# Patient Record
Sex: Female | Born: 1960 | Race: White | Hispanic: No | Marital: Married | Smoking: Never smoker
Health system: Southern US, Community
[De-identification: ages and names within clinical notes are randomized; demographics above are authoritative.]

## PROBLEM LIST (undated history)

## (undated) DIAGNOSIS — M79605 Pain in left leg: Secondary | ICD-10-CM

## (undated) DIAGNOSIS — M79604 Pain in right leg: Secondary | ICD-10-CM

## (undated) DIAGNOSIS — I10 Essential (primary) hypertension: Secondary | ICD-10-CM

## (undated) DIAGNOSIS — M5430 Sciatica, unspecified side: Secondary | ICD-10-CM

## (undated) HISTORY — DX: Essential (primary) hypertension: I10

## (undated) HISTORY — DX: Sciatica, unspecified side: M54.30

## (undated) HISTORY — PX: MYOMECTOMY: SHX85

## (undated) HISTORY — DX: Pain in left leg: M79.605

## (undated) HISTORY — DX: Pain in right leg: M79.604

---

## 1998-04-11 ENCOUNTER — Other Ambulatory Visit: Admission: RE | Admit: 1998-04-11 | Discharge: 1998-04-11 | Payer: Self-pay | Admitting: Obstetrics and Gynecology

## 1998-10-12 ENCOUNTER — Inpatient Hospital Stay (HOSPITAL_COMMUNITY): Admission: AD | Admit: 1998-10-12 | Discharge: 1998-10-15 | Payer: Self-pay | Admitting: Obstetrics and Gynecology

## 1998-11-23 ENCOUNTER — Other Ambulatory Visit: Admission: RE | Admit: 1998-11-23 | Discharge: 1998-11-23 | Payer: Self-pay | Admitting: Obstetrics and Gynecology

## 2000-01-10 ENCOUNTER — Other Ambulatory Visit: Admission: RE | Admit: 2000-01-10 | Discharge: 2000-01-10 | Payer: Self-pay | Admitting: Obstetrics and Gynecology

## 2016-03-11 ENCOUNTER — Other Ambulatory Visit: Payer: Self-pay | Admitting: Internal Medicine

## 2016-03-11 DIAGNOSIS — E2839 Other primary ovarian failure: Secondary | ICD-10-CM

## 2016-03-21 ENCOUNTER — Other Ambulatory Visit: Payer: Self-pay

## 2016-03-21 ENCOUNTER — Other Ambulatory Visit: Payer: Self-pay | Admitting: Internal Medicine

## 2016-03-21 DIAGNOSIS — Z1231 Encounter for screening mammogram for malignant neoplasm of breast: Secondary | ICD-10-CM

## 2016-04-12 ENCOUNTER — Ambulatory Visit
Admission: RE | Admit: 2016-04-12 | Discharge: 2016-04-12 | Disposition: A | Discharge status: Home / Self Care | Payer: PRIVATE HEALTH INSURANCE | Source: Ambulatory Visit | Attending: Internal Medicine | Admitting: Internal Medicine

## 2016-04-12 DIAGNOSIS — Z1231 Encounter for screening mammogram for malignant neoplasm of breast: Secondary | ICD-10-CM

## 2016-04-12 DIAGNOSIS — E2839 Other primary ovarian failure: Secondary | ICD-10-CM

## 2016-04-22 DIAGNOSIS — I10 Essential (primary) hypertension: Secondary | ICD-10-CM

## 2016-04-22 HISTORY — DX: Essential (primary) hypertension: I10

## 2016-09-10 ENCOUNTER — Encounter: Payer: Self-pay | Admitting: Gastroenterology

## 2016-10-10 ENCOUNTER — Ambulatory Visit (AMBULATORY_SURGERY_CENTER): Payer: Self-pay

## 2016-10-10 VITALS — Ht 62.0 in | Wt 133.8 lb

## 2016-10-10 DIAGNOSIS — Z1211 Encounter for screening for malignant neoplasm of colon: Secondary | ICD-10-CM

## 2016-10-10 MED ORDER — NA SULFATE-K SULFATE-MG SULF 17.5-3.13-1.6 GM/177ML PO SOLN: ORAL | 0 refills | Status: DC

## 2016-10-10 NOTE — Progress Notes (Signed)
Per pt, no allergies to soy or egg products.Pt not taking any weight loss meds or using  O2 at home.  Enmmi video sent to pt's email.

## 2016-10-15 ENCOUNTER — Encounter: Payer: Self-pay | Admitting: Gastroenterology

## 2016-10-24 ENCOUNTER — Ambulatory Visit (AMBULATORY_SURGERY_CENTER): Payer: PRIVATE HEALTH INSURANCE | Admitting: Gastroenterology

## 2016-10-24 ENCOUNTER — Encounter: Payer: Self-pay | Admitting: Gastroenterology

## 2016-10-24 VITALS — BP 135/87 | HR 70 | Temp 97.3°F | Resp 17 | Ht 62.0 in | Wt 133.0 lb

## 2016-10-24 DIAGNOSIS — Z1212 Encounter for screening for malignant neoplasm of rectum: Secondary | ICD-10-CM

## 2016-10-24 DIAGNOSIS — Z1211 Encounter for screening for malignant neoplasm of colon: Secondary | ICD-10-CM | POA: Diagnosis present

## 2016-10-24 MED ORDER — SODIUM CHLORIDE 0.9 % IV SOLN: 500.0000 mL | INTRAVENOUS | Status: DC

## 2016-10-24 NOTE — Patient Instructions (Signed)
Discharge instructions given. Norma exam. Resume previous medications. YOU HAD AN ENDOSCOPIC PROCEDURE TODAY AT THE [facility]:   Refer to the procedure report that was given to you for any specific questions about what was found during the examination.  If the procedure report does not answer your questions, please call your gastroenterologist to clarify.  If you requested that your care partner not be given the details of your procedure findings, then the procedure report has been included in a sealed envelope for you to review at your convenience later.  YOU SHOULD EXPECT: Some feelings of bloating in the abdomen. Passage of more gas than usual.  Walking can help get rid of the air that was put into your GI tract during the procedure and reduce the bloating. If you had a lower endoscopy (such as a colonoscopy or flexible sigmoidoscopy) you may notice spotting of blood in your stool or on the toilet paper. If you underwent a bowel prep for your procedure, you may not have a normal bowel movement for a few days.  Please Note:  You might notice some irritation and congestion in your nose or some drainage.  This is from the oxygen used during your procedure.  There is no need for concern and it should clear up in a day or so.  SYMPTOMS TO REPORT IMMEDIATELY:   Following lower endoscopy (colonoscopy or flexible sigmoidoscopy):  Excessive amounts of blood in the stool  Significant tenderness or worsening of abdominal pains  Swelling of the abdomen that is new, acute  Fever of 100F or higher   For urgent or emergent issues, a gastroenterologist can be reached at any hour by calling [phone].   DIET:  We do recommend a small meal at first, but then you may proceed to your regular diet.  Drink plenty of fluids but you should avoid alcoholic beverages for 24 hours.  ACTIVITY:  You should plan to take it easy for the rest of today and you should NOT DRIVE or use heavy machinery  until tomorrow (because of the sedation medicines used during the test).    FOLLOW UP: Our staff will call the number listed on your records the next business day following your procedure to check on you and address any questions or concerns that you may have regarding the information given to you following your procedure. If we do not reach you, we will leave a message.  However, if you are feeling well and you are not experiencing any problems, there is no need to return our call.  We will assume that you have returned to your regular daily activities without incident.  If any biopsies were taken you will be contacted by phone or by letter within the next 1-3 weeks.  Please call us at [phone] if you have not heard about the biopsies in 3 weeks.    SIGNATURES/CONFIDENTIALITY: You and/or your care partner have signed paperwork which will be entered into your electronic medical record.  These signatures attest to the fact that that the information above on your After Visit Summary has been reviewed and is understood.  Full responsibility of the confidentiality of this discharge information lies with you and/or your care-partner.

## 2016-10-24 NOTE — Progress Notes (Signed)
Alert and oriented x3, pleased with MAC, report to RN

## 2016-10-24 NOTE — Progress Notes (Signed)
Pt's states no medical or surgical changes since previsit or office visit. 

## 2016-10-24 NOTE — Op Note (Addendum)
[facility] Patient Name: Maria Delacruz Procedure Date: 10/24/2016 10:49 AM MRN: [id number] Endoscopist: Sherilyn Cooter L. Myrtie Neither , MD Age: 56 Referring MD:  Date of Birth: [birth year] Gender: Female Account #: [id number] Procedure:                Colonoscopy Indications:              Screening for colorectal malignant neoplasm Medicines:                Monitored Anesthesia Care Procedure:                Pre-Anesthesia Assessment:                           - Prior to the procedure, a History and Physical                            was performed, and patient medications and                            allergies were reviewed. The patient's tolerance of                            previous anesthesia was also reviewed. The risks                            and benefits of the procedure and the sedation                            options and risks were discussed with the patient.                            All questions were answered, and informed consent                            was obtained. Prior Anticoagulants: The patient has                            taken no previous anticoagulant or antiplatelet                            agents. ASA Grade Assessment: II - A patient with                            mild systemic disease. After reviewing the risks                            and benefits, the patient was deemed in                            satisfactory condition to undergo the procedure.                           After obtaining informed consent, the colonoscope  was passed under direct vision. Throughout the                            procedure, the patient's blood pressure, pulse, and                            oxygen saturations were monitored continuously. The                            Colonoscope was introduced through the anus and                            advanced to the the cecum, identified by                            appendiceal orifice and  ileocecal valve. The                            colonoscopy was performed without difficulty. The                            patient tolerated the procedure well. The quality                            of the bowel preparation was good. The ileocecal                            valve, appendiceal orifice, and rectum were                            photographed. The quality of the bowel preparation                            was evaluated using the BBPS [facility] Bowel                            Preparation Scale) with scores of: Right Colon = 2,                            Transverse Colon = 2 and Left Colon = 2. The total                            BBPS score equals 6. The bowel preparation used was                            SUPREP. Scope In: 10:56:09 AM Scope Out: 11:08:34 AM Scope Withdrawal Time: 0 hours 8 minutes 14 seconds  Total Procedure Duration: 0 hours 12 minutes 25 seconds  Findings:                 The perianal and digital rectal examinations were                            normal.  The entire examined colon appeared normal on direct                            and retroflexion views. Complications:            No immediate complications. Estimated Blood Loss:     Estimated blood loss: none. Impression:               - The entire examined colon is normal on direct and                            retroflexion views.                           - No specimens collected. Recommendation:           - Patient has a contact number available for                            emergencies. The signs and symptoms of potential                            delayed complications were discussed with the                            patient. Return to normal activities tomorrow.                            Written discharge instructions were provided to the                            patient.                           - Resume previous diet.                           - Continue  present medications.                           - Repeat colonoscopy in 10 years for screening                            purposes. Marnita Poirier L. Myrtie Neitheranis, MD 10/24/2016 11:17:05 AM This report has been signed electronically.

## 2016-10-25 ENCOUNTER — Telehealth: Payer: Self-pay | Admitting: *Deleted

## 2016-10-25 NOTE — Telephone Encounter (Signed)
  Follow up Call-  Call back number 10/24/2016  Post procedure Call Back phone  # [phone]  Permission to leave phone message Yes  Some recent data might be hidden     Patient questions:  Do you have a fever, pain , or abdominal swelling? No. Pain Score  0 *  Have you tolerated food without any problems? Yes.    Have you been able to return to your normal activities? Yes.    Do you have any questions about your discharge instructions: Diet   No. Medications  No. Follow up visit  No.  Do you have questions or concerns about your Care? No.  Actions: * If pain score is 4 or above: No action needed, pain <4.

## 2019-06-01 ENCOUNTER — Ambulatory Visit: Payer: PRIVATE HEALTH INSURANCE | Admitting: Family Medicine

## 2019-06-01 VITALS — BP 124/78 | HR 78 | Resp 16 | Ht 62.0 in | Wt 145.0 lb

## 2019-06-01 DIAGNOSIS — Z789 Other specified health status: Secondary | ICD-10-CM

## 2019-06-01 NOTE — Progress Notes (Signed)
  Subjective:     Patient ID: Maria Delacruz, female   DOB: 1960-06-19, 59 y.o.   MRN: [id number]  HPI Maria Delacruz presents to the employee health clinic today for her required wellness visit for her insurance. PMH and health maintenance items reviewed. She has a PCP, Dr. Concepcion Delacruz, who she last saw about 6 months ago. States lab work was completed. She reports her last mammogram was in July 2020 and normal. Last pap smear was in April 2020 and normal, and last colonoscopy was in 2018 and normal, f/u in 10 years. She received a flu shot last year and has completed both doses of the covid-19 vaccine. She denies any current problems or concerns.   Past Medical History:  Diagnosis Date  . Hypertension 04/2016   Due to stress.  . Leg pain, bilateral   . Sciatica    No Known Allergies  Current Outpatient Medications:  Marland Kitchen  Multiple Vitamins-Minerals (ONE-A-DAY WOMENS 50 PLUS PO), Take by mouth daily., Disp: , Rfl:     Review of Systems  Constitutional: Negative for chills, fatigue, fever and unexpected weight change.  HENT: Negative for congestion, ear pain, sinus pressure, sinus pain and sore throat.   Eyes: Negative for discharge and visual disturbance.  Respiratory: Negative for cough, shortness of breath and wheezing.   Cardiovascular: Negative for chest pain and leg swelling.  Gastrointestinal: Negative for abdominal pain, blood in stool, constipation, diarrhea, nausea and vomiting.  Genitourinary: Negative for difficulty urinating and hematuria.  Skin: Negative for color change.  Neurological: Negative for dizziness, weakness, light-headedness and headaches.  Hematological: Negative for adenopathy.  All other systems reviewed and are negative.      Objective:   Physical Exam Vitals and nursing note reviewed.  Constitutional:      General: She is not in acute distress.    Appearance: Normal appearance. She is well-developed. She is not toxic-appearing.  HENT:     Head: Normocephalic  and atraumatic.  Cardiovascular:     Rate and Rhythm: Normal rate and regular rhythm.     Heart sounds: Normal heart sounds. No murmur.  Pulmonary:     Effort: Pulmonary effort is normal. No respiratory distress.     Breath sounds: Normal breath sounds.  Musculoskeletal:     Cervical back: Neck supple.  Skin:    General: Skin is warm and dry.  Neurological:     Mental Status: She is alert and oriented to person, place, and time.  Psychiatric:        Mood and Affect: Mood normal.        Behavior: Behavior normal.    Today's Vitals   06/01/19 0945  BP: 124/78  Pulse: 78  Resp: 16  SpO2: 98%  Weight: 145 lb (65.8 kg)  Height: 5\' 2"  (1.575 m)   Body mass index is 26.52 kg/m.      Assessment:     Participant in health and wellness plan      Plan:     1. Keep all regular appts with PCP. 2. Encouraged healthy diet and regular exercise.  3. F/u here prn.

## 2019-08-25 ENCOUNTER — Other Ambulatory Visit (HOSPITAL_COMMUNITY)
Admission: RE | Admit: 2019-08-25 | Discharge: 2019-08-25 | Disposition: A | Discharge status: Home / Self Care | Payer: PRIVATE HEALTH INSURANCE | Source: Ambulatory Visit | Attending: Internal Medicine | Admitting: Internal Medicine

## 2019-08-25 ENCOUNTER — Ambulatory Visit: Payer: PRIVATE HEALTH INSURANCE | Admitting: Internal Medicine

## 2019-08-25 ENCOUNTER — Ambulatory Visit: Payer: Self-pay | Admitting: Internal Medicine

## 2019-08-25 ENCOUNTER — Other Ambulatory Visit: Payer: Self-pay

## 2019-08-25 ENCOUNTER — Encounter: Payer: Self-pay | Admitting: Internal Medicine

## 2019-08-25 VITALS — BP 110/88 | HR 76 | Temp 98.1°F | Ht 62.0 in | Wt 152.0 lb

## 2019-08-25 DIAGNOSIS — Z13220 Encounter for screening for lipoid disorders: Secondary | ICD-10-CM

## 2019-08-25 DIAGNOSIS — Z124 Encounter for screening for malignant neoplasm of cervix: Secondary | ICD-10-CM | POA: Insufficient documentation

## 2019-08-25 DIAGNOSIS — Z1231 Encounter for screening mammogram for malignant neoplasm of breast: Secondary | ICD-10-CM

## 2019-08-25 DIAGNOSIS — Z1329 Encounter for screening for other suspected endocrine disorder: Secondary | ICD-10-CM | POA: Insufficient documentation

## 2019-08-25 DIAGNOSIS — R8290 Unspecified abnormal findings in urine: Secondary | ICD-10-CM

## 2019-08-25 DIAGNOSIS — Z1321 Encounter for screening for nutritional disorder: Secondary | ICD-10-CM

## 2019-08-25 DIAGNOSIS — Z780 Asymptomatic menopausal state: Secondary | ICD-10-CM | POA: Diagnosis not present

## 2019-08-25 DIAGNOSIS — Z0000 Encounter for general adult medical examination without abnormal findings: Secondary | ICD-10-CM

## 2019-08-25 LAB — POCT URINALYSIS DIPSTICK
Appearance: NEGATIVE
Bilirubin, UA: NEGATIVE
Blood, UA: NEGATIVE
Glucose, UA: NEGATIVE
Ketones, UA: NEGATIVE
Nitrite, UA: NEGATIVE
Odor: NEGATIVE
Protein, UA: POSITIVE — AB
Spec Grav, UA: 1.01 (ref 1.010–1.025)
Urobilinogen, UA: 0.2 E.U./dL
pH, UA: 6 (ref 5.0–8.0)

## 2019-08-25 NOTE — Progress Notes (Signed)
   Subjective:    Patient ID: Maria Delacruz, female    DOB: 20-Dec-1960, 59 y.o.   MRN: [id number]  HPI Pleasant 59 year old Female, native of [place], seen today for the first time. Husband is also a patient here.  Her general health is excellent.  No known drug allergies.  Hospitalized only for childbirth by Dr. Cherly Hensen in June 2000.  No chronic medications.  Does take multivitamin daily.  Had tetanus immunization July  2018.  Had colonoscopy by Dr. Myrtie Neither in July 2018.  10-year follow-up recommended.  Entire colon was normal.  Last mammogram on file was December 2017.  This will be ordered.  Had bone density study December 2017 and lowest T score was -0.2.  Will not repeat at this point in time.  Patient is menopausal.  No issues with hot flashes.  Generally feels well.  Social history: She is married.  2 adult sons 1 age 55 and the other 1 age 56.  She is employed as a Agricultural engineer at [employer].  Family history: Father deceased at age 78 of cancer.  Mother living age 58 in good health.  She has 6 brothers and 4 sisters.  Some of the brothers have diabetes and hypertension.    Review of Systems only complaint is a bunion right MTP joint that sometimes gets tender and red.  Uric acid will be checked.  No chest pain, no shortness of breath.  No bowel issues.  No urinary issues.     Objective:   Physical Exam Blood pressure 110/88, pulse 76 temperature 98.1 degrees orally pulse oximetry 97% weight 152 pounds BMI 27.80  Skin warm and dry.  Nodes none.  Some cerumen in ear canals.  Neck is supple without JVD thyromegaly or carotid bruits.  Chest clear to auscultation.  Cardiac exam regular rate and rhythm normal S1 and S2 without murmurs or gallops.  Breast without masses.  Abdomen soft nondistended without hepatosplenomegaly masses or tenderness.  Pelvic exam: N FDG.  Pap smear taken.  No masses on bimanual exam rectovaginal confirms.  Has a small bunion right MTP joint that  currently is not inflamed or red.  Neuro intact without focal deficits.  Thought judgment and affect are normal.       Assessment & Plan:  Bunion right MTP joint  Fasting labs drawn and pending  Plan: Order placed for mammogram.  She is up-to-date on colonoscopy and had bone density study that was normal in 2017.  She has had 2 Covid vaccines and tetanus immunization is up-to-date.  Depending on lab work she can return in 1 year or as needed.

## 2019-08-25 NOTE — Patient Instructions (Signed)
It was a pleasure to see you today.  Order placed for mammogram.  Labs drawn and pending.  Expect if labs are normal, return in 1 year or as needed.

## 2019-08-26 ENCOUNTER — Other Ambulatory Visit: Payer: Self-pay

## 2019-08-26 DIAGNOSIS — R718 Other abnormality of red blood cells: Secondary | ICD-10-CM

## 2019-08-26 LAB — URINE CULTURE
MICRO NUMBER:: 10442103
Result:: NO GROWTH
SPECIMEN QUALITY:: ADEQUATE

## 2019-08-26 LAB — CYTOLOGY - PAP
Comment: NEGATIVE
Diagnosis: NEGATIVE
High risk HPV: NEGATIVE

## 2019-08-27 ENCOUNTER — Other Ambulatory Visit: Payer: Self-pay

## 2019-08-27 MED ORDER — VITAMIN D (ERGOCALCIFEROL) 1.25 MG (50000 UNIT) PO CAPS
50000.0000 [IU] | ORAL_CAPSULE | ORAL | 0 refills | Status: DC
Start: 2019-08-27 — End: 2020-02-24

## 2019-08-28 LAB — COMPLETE METABOLIC PANEL WITH GFR
AG Ratio: 1.3 (calc) (ref 1.0–2.5)
ALT: 18 U/L (ref 6–29)
AST: 21 U/L (ref 10–35)
Albumin: 4.1 g/dL (ref 3.6–5.1)
Alkaline phosphatase (APISO): 101 U/L (ref 37–153)
BUN: 11 mg/dL (ref 7–25)
CO2: 27 mmol/L (ref 20–32)
Calcium: 9.8 mg/dL (ref 8.6–10.4)
Chloride: 108 mmol/L (ref 98–110)
Creat: 0.84 mg/dL (ref 0.50–1.05)
GFR, Est African American: 89 mL/min/{1.73_m2} (ref >=60)
GFR, Est Non African American: 77 mL/min/{1.73_m2} (ref >=60)
Globulin: 3.2 g/dL (calc) (ref 1.9–3.7)
Glucose, Bld: 90 mg/dL (ref 65–99)
Potassium: 4.1 mmol/L (ref 3.5–5.3)
Sodium: 142 mmol/L (ref 135–146)
Total Bilirubin: 0.4 mg/dL (ref 0.2–1.2)
Total Protein: 7.3 g/dL (ref 6.1–8.1)

## 2019-08-28 LAB — CBC WITH DIFFERENTIAL/PLATELET
Absolute Monocytes: 488 cells/uL (ref 200–950)
Basophils Absolute: 20 cells/uL (ref 0–200)
Basophils Relative: 0.3 %
Eosinophils Absolute: 152 cells/uL (ref 15–500)
Eosinophils Relative: 2.3 %
HCT: 39.7 % (ref 35.0–45.0)
Hemoglobin: 12.1 g/dL (ref 11.7–15.5)
Lymphs Abs: 3221 cells/uL (ref 850–3900)
MCH: 21.3 pg — ABNORMAL LOW (ref 27.0–33.0)
MCHC: 30.5 g/dL — ABNORMAL LOW (ref 32.0–36.0)
MCV: 70 fL — ABNORMAL LOW (ref 80.0–100.0)
MPV: 12.1 fL (ref 7.5–12.5)
Monocytes Relative: 7.4 %
Neutro Abs: 2719 cells/uL (ref 1500–7800)
Neutrophils Relative %: 41.2 %
Platelets: 332 10*3/uL (ref 140–400)
RBC: 5.67 10*6/uL — ABNORMAL HIGH (ref 3.80–5.10)
RDW: 15.7 % — ABNORMAL HIGH (ref 11.0–15.0)
Total Lymphocyte: 48.8 %
WBC: 6.6 10*3/uL (ref 3.8–10.8)

## 2019-08-28 LAB — LIPID PANEL
Cholesterol: 217 mg/dL — ABNORMAL HIGH (ref <200)
HDL: 48 mg/dL — ABNORMAL LOW (ref >=50)
LDL Cholesterol (Calc): 146 mg/dL (calc) — ABNORMAL HIGH
Non-HDL Cholesterol (Calc): 169 mg/dL (calc) — ABNORMAL HIGH (ref <130)
Total CHOL/HDL Ratio: 4.5 (calc) (ref <5.0)
Triglycerides: 114 mg/dL (ref <150)

## 2019-08-28 LAB — TSH: TSH: 3.8 mIU/L (ref 0.40–4.50)

## 2019-08-28 LAB — TEST AUTHORIZATION

## 2019-08-28 LAB — IRON,TIBC AND FERRITIN PANEL
%SAT: 23 % (calc) (ref 16–45)
Ferritin: 40 ng/mL (ref 16–232)
Iron: 79 ug/dL (ref 45–160)
TIBC: 337 mcg/dL (calc) (ref 250–450)

## 2019-08-28 LAB — URIC ACID: Uric Acid, Serum: 5 mg/dL (ref 2.5–7.0)

## 2019-08-28 LAB — VITAMIN D 25 HYDROXY (VIT D DEFICIENCY, FRACTURES): Vit D, 25-Hydroxy: 21 ng/mL — ABNORMAL LOW (ref 30–100)

## 2019-08-31 ENCOUNTER — Ambulatory Visit
Admission: RE | Admit: 2019-08-31 | Discharge: 2019-08-31 | Disposition: A | Discharge status: Home / Self Care | Payer: PRIVATE HEALTH INSURANCE | Source: Ambulatory Visit | Attending: Internal Medicine | Admitting: Internal Medicine

## 2019-08-31 ENCOUNTER — Other Ambulatory Visit: Payer: Self-pay

## 2019-08-31 DIAGNOSIS — Z1231 Encounter for screening mammogram for malignant neoplasm of breast: Secondary | ICD-10-CM

## 2020-02-22 ENCOUNTER — Other Ambulatory Visit: Payer: PRIVATE HEALTH INSURANCE | Admitting: Internal Medicine

## 2020-02-22 ENCOUNTER — Other Ambulatory Visit: Payer: Self-pay

## 2020-02-22 DIAGNOSIS — E782 Mixed hyperlipidemia: Secondary | ICD-10-CM

## 2020-02-23 LAB — LIPID PANEL
Cholesterol: 252 mg/dL — ABNORMAL HIGH (ref <200)
HDL: 60 mg/dL (ref >=50)
LDL Cholesterol (Calc): 171 mg/dL (calc) — ABNORMAL HIGH
Non-HDL Cholesterol (Calc): 192 mg/dL (calc) — ABNORMAL HIGH (ref <130)
Total CHOL/HDL Ratio: 4.2 (calc) (ref <5.0)
Triglycerides: 94 mg/dL (ref <150)

## 2020-02-24 ENCOUNTER — Ambulatory Visit: Payer: PRIVATE HEALTH INSURANCE | Admitting: Internal Medicine

## 2020-02-24 ENCOUNTER — Encounter: Payer: Self-pay | Admitting: Internal Medicine

## 2020-02-24 ENCOUNTER — Other Ambulatory Visit: Payer: Self-pay

## 2020-02-24 VITALS — BP 120/70 | HR 66 | Ht 62.0 in | Wt 154.0 lb

## 2020-02-24 DIAGNOSIS — R232 Flushing: Secondary | ICD-10-CM

## 2020-02-24 DIAGNOSIS — E781 Pure hyperglyceridemia: Secondary | ICD-10-CM | POA: Diagnosis not present

## 2020-02-24 DIAGNOSIS — M21612 Bunion of left foot: Secondary | ICD-10-CM

## 2020-02-24 MED ORDER — ROSUVASTATIN CALCIUM 5 MG PO TABS: 5.0000 mg | ORAL_TABLET | Freq: Every day | ORAL | 1 refills | Status: DC

## 2020-02-24 NOTE — Patient Instructions (Addendum)
Start Crestor 5 mg daily and follow-up in 6 months at time of health maintenance exam..  Take it with supper.  May need to see podiatrist or orthopedist regarding bunion of left foot.  Would not suggest estrogen replacement for hot flashes unless they are just extremely intolerable.  We can refer you to GYN should you so desire.

## 2020-02-24 NOTE — Progress Notes (Signed)
   Subjective:    Patient ID: Maria Delacruz, female    DOB: 29-Sep-1960, 59 y.o.   MRN: [id number]  HPI 59 year old Female for 6 month follow up appointment.  She feels well and has no new complaints.  Does not take any chronic medications.  Presented here for the first time in May 2021 for fasting labs and health maintenance examination.  CBC revealed a low MCV of 70.  Her iron level was subsequently checked and was found to be normal.  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  Also at that time total cholesterol was 217, HDL 48, and LDL cholesterol 146.  She was advised to watch diet, continue exercise regimen and follow-up this month.  Recent lipid panel shows total cholesterol being 252 increased from 217 and LDL cholesterol has gone up from 146 to 171.  I would like her to take Crestor 5 mg daily.  After discussion, she is agreeable to this.    Review of Systems no chest pain, shortness of breath, abdominal issues.     Objective:   Physical Exam Blood pressure 120/70 pulse 66 pulse oximetry 96% weight 154 pounds BMI 28.17 No thyromegaly.  No carotid bruits.  Chest is clear to auscultation.  Cardiac exam regular rate and rhythm normal S1 and S2 without murmurs or gallops.  No lower extremity pitting edema.  Continues to have issues with bunion of left foot and may need to see orthopedist or podiatrist.  Also having issues with hot flashes.  Explained to her that it would best not to be treated with estrogen replacement.  We can refer her to GYN for further discussion of this issue.      Assessment & Plan:  Pure hypercholesterolemia-begin Crestor 5 mg daily and follow-up at physical exam in May.  Hot flashes-can be referred to GYN physician but would prefer she not start estrogen therapy.  Has been menopausal for several years.  Bunion left foot-May need to see podiatrist or orthopedist

## 2020-07-28 ENCOUNTER — Other Ambulatory Visit: Payer: Self-pay | Admitting: Internal Medicine

## 2020-07-28 DIAGNOSIS — Z1231 Encounter for screening mammogram for malignant neoplasm of breast: Secondary | ICD-10-CM

## 2020-08-01 ENCOUNTER — Ambulatory Visit: Payer: PRIVATE HEALTH INSURANCE | Admitting: Family Medicine

## 2020-08-01 VITALS — BP 118/82 | HR 72 | Ht 62.0 in | Wt 145.0 lb

## 2020-08-01 DIAGNOSIS — Z789 Other specified health status: Secondary | ICD-10-CM

## 2020-08-01 NOTE — Progress Notes (Signed)
  Subjective:     Patient ID: Maria Delacruz, female   DOB: 1960/06/11, 60 y.o.   MRN: [id number]  HPI Maria Delacruz presents to the employee health and wellness clinic today for her required wellness visit for her insurance. Her PCP is Dr. Lenord Fellers, who she sees regularly. She is UTD on all her health maintenance items. She has no current problems or concerns. She reports eating healthy but does not get any exercise and needs to work on this.   Past Medical History:  Diagnosis Date  . Hypertension 04/2016   Due to stress.  . Leg pain, bilateral   . Sciatica    No Known Allergies  Current Outpatient Medications:  Marland Kitchen  Multiple Vitamins-Minerals (ONE-A-DAY WOMENS 50 PLUS PO), Take by mouth daily., Disp: , Rfl:  .  rosuvastatin (CRESTOR) 5 MG tablet, Take 1 tablet (5 mg total) by mouth daily., Disp: 90 tablet, Rfl: 1  Review of Systems  Constitutional: Negative for chills, fatigue, fever and unexpected weight change.  HENT: Negative for congestion, ear pain, sinus pressure, sinus pain and sore throat.   Eyes: Negative for discharge and visual disturbance.  Respiratory: Negative for cough, shortness of breath and wheezing.   Cardiovascular: Negative for chest pain and leg swelling.  Gastrointestinal: Negative for abdominal pain, blood in stool, constipation, diarrhea, nausea and vomiting.  Genitourinary: Negative for difficulty urinating and hematuria.  Skin: Negative for color change.  Neurological: Negative for dizziness, weakness, light-headedness and headaches.  Hematological: Negative for adenopathy.  All other systems reviewed and are negative.      Objective:   Physical Exam Vitals reviewed.  Constitutional:      General: She is not in acute distress.    Appearance: Normal appearance. She is well-developed.  HENT:     Head: Normocephalic and atraumatic.  Eyes:     General:        Right eye: No discharge.        Left eye: No discharge.  Cardiovascular:     Rate and Rhythm:  Normal rate and regular rhythm.     Heart sounds: Normal heart sounds.  Pulmonary:     Effort: Pulmonary effort is normal. No respiratory distress.     Breath sounds: Normal breath sounds.  Musculoskeletal:     Cervical back: Neck supple.  Skin:    General: Skin is warm and dry.  Neurological:     Mental Status: She is alert and oriented to person, place, and time.  Psychiatric:        Mood and Affect: Mood normal.        Behavior: Behavior normal.    Today's Vitals   08/01/20 0836  BP: 118/82  Pulse: 72  SpO2: 98%  Weight: 145 lb (65.8 kg)  Height: 5\' 2"  (1.575 m)   Body mass index is 26.52 kg/m.     Assessment:     Participant in health and wellness plan      Plan:     1. Keep all regular appts with PCP. Continue efforts at eating a healthy diet and work on increasing physical activity. F/u here prn.

## 2020-08-18 ENCOUNTER — Other Ambulatory Visit: Payer: PRIVATE HEALTH INSURANCE | Admitting: Internal Medicine

## 2020-08-18 ENCOUNTER — Other Ambulatory Visit: Payer: Self-pay

## 2020-08-18 DIAGNOSIS — E781 Pure hyperglyceridemia: Secondary | ICD-10-CM

## 2020-08-18 DIAGNOSIS — Z1329 Encounter for screening for other suspected endocrine disorder: Secondary | ICD-10-CM

## 2020-08-18 DIAGNOSIS — Z1321 Encounter for screening for nutritional disorder: Secondary | ICD-10-CM

## 2020-08-18 DIAGNOSIS — M21612 Bunion of left foot: Secondary | ICD-10-CM

## 2020-08-18 DIAGNOSIS — E7800 Pure hypercholesterolemia, unspecified: Secondary | ICD-10-CM

## 2020-08-18 DIAGNOSIS — Z0000 Encounter for general adult medical examination without abnormal findings: Secondary | ICD-10-CM

## 2020-08-18 DIAGNOSIS — E785 Hyperlipidemia, unspecified: Secondary | ICD-10-CM

## 2020-08-18 DIAGNOSIS — E559 Vitamin D deficiency, unspecified: Secondary | ICD-10-CM

## 2020-08-19 LAB — CBC WITH DIFFERENTIAL/PLATELET
Absolute Monocytes: 442 cells/uL (ref 200–950)
Basophils Absolute: 33 cells/uL (ref 0–200)
Basophils Relative: 0.5 %
Eosinophils Absolute: 91 cells/uL (ref 15–500)
Eosinophils Relative: 1.4 %
HCT: 40.4 % (ref 35.0–45.0)
Hemoglobin: 12.3 g/dL (ref 11.7–15.5)
Lymphs Abs: 3218 cells/uL (ref 850–3900)
MCH: 21.4 pg — ABNORMAL LOW (ref 27.0–33.0)
MCHC: 30.4 g/dL — ABNORMAL LOW (ref 32.0–36.0)
MCV: 70.3 fL — ABNORMAL LOW (ref 80.0–100.0)
MPV: 10.8 fL (ref 7.5–12.5)
Monocytes Relative: 6.8 %
Neutro Abs: 2717 cells/uL (ref 1500–7800)
Neutrophils Relative %: 41.8 %
Platelets: 313 10*3/uL (ref 140–400)
RBC: 5.75 10*6/uL — ABNORMAL HIGH (ref 3.80–5.10)
RDW: 15.9 % — ABNORMAL HIGH (ref 11.0–15.0)
Total Lymphocyte: 49.5 %
WBC: 6.5 10*3/uL (ref 3.8–10.8)

## 2020-08-19 LAB — COMPLETE METABOLIC PANEL WITH GFR
AG Ratio: 1.4 (calc) (ref 1.0–2.5)
ALT: 26 U/L (ref 6–29)
AST: 41 U/L — ABNORMAL HIGH (ref 10–35)
Albumin: 4.2 g/dL (ref 3.6–5.1)
Alkaline phosphatase (APISO): 85 U/L (ref 37–153)
BUN: 9 mg/dL (ref 7–25)
CO2: 29 mmol/L (ref 20–32)
Calcium: 10.1 mg/dL (ref 8.6–10.4)
Chloride: 105 mmol/L (ref 98–110)
Creat: 0.8 mg/dL (ref 0.50–1.05)
GFR, Est African American: 94 mL/min/{1.73_m2} (ref >=60)
GFR, Est Non African American: 81 mL/min/{1.73_m2} (ref >=60)
Globulin: 3.1 g/dL (calc) (ref 1.9–3.7)
Glucose, Bld: 84 mg/dL (ref 65–99)
Potassium: 4.1 mmol/L (ref 3.5–5.3)
Sodium: 141 mmol/L (ref 135–146)
Total Bilirubin: 0.5 mg/dL (ref 0.2–1.2)
Total Protein: 7.3 g/dL (ref 6.1–8.1)

## 2020-08-19 LAB — VITAMIN D 25 HYDROXY (VIT D DEFICIENCY, FRACTURES): Vit D, 25-Hydroxy: 43 ng/mL (ref 30–100)

## 2020-08-19 LAB — LIPID PANEL
Cholesterol: 173 mg/dL (ref <200)
HDL: 56 mg/dL (ref >=50)
LDL Cholesterol (Calc): 97 mg/dL (calc)
Non-HDL Cholesterol (Calc): 117 mg/dL (calc) (ref <130)
Total CHOL/HDL Ratio: 3.1 (calc) (ref <5.0)
Triglycerides: 103 mg/dL (ref <150)

## 2020-08-19 LAB — TSH: TSH: 2.52 mIU/L (ref 0.40–4.50)

## 2020-08-25 ENCOUNTER — Other Ambulatory Visit: Payer: Self-pay

## 2020-08-25 ENCOUNTER — Encounter: Payer: Self-pay | Admitting: Internal Medicine

## 2020-08-25 ENCOUNTER — Ambulatory Visit (INDEPENDENT_AMBULATORY_CARE_PROVIDER_SITE_OTHER): Payer: No Typology Code available for payment source | Admitting: Internal Medicine

## 2020-08-25 VITALS — BP 140/90 | HR 61 | Ht 62.0 in | Wt 152.0 lb

## 2020-08-25 DIAGNOSIS — Z6827 Body mass index (BMI) 27.0-27.9, adult: Secondary | ICD-10-CM

## 2020-08-25 DIAGNOSIS — Z0000 Encounter for general adult medical examination without abnormal findings: Secondary | ICD-10-CM | POA: Diagnosis not present

## 2020-08-25 LAB — POCT URINALYSIS DIPSTICK
Appearance: NEGATIVE
Bilirubin, UA: NEGATIVE
Blood, UA: NEGATIVE
Glucose, UA: NEGATIVE
Ketones, UA: NEGATIVE
Leukocytes, UA: NEGATIVE
Nitrite, UA: NEGATIVE
Odor: NEGATIVE
Protein, UA: NEGATIVE
Spec Grav, UA: 1.01 (ref 1.010–1.025)
Urobilinogen, UA: 0.2 E.U./dL
pH, UA: 6.5 (ref 5.0–8.0)

## 2020-08-25 NOTE — Progress Notes (Signed)
   Subjective:    Patient ID: Maria Delacruz, female    DOB: [birth year], 61 y.o.   MRN: [id number]  HPI 60 year old Female see for health maintenance exam and evalutaion of medical issues.  Her general health is excellent.  No known drug allergies.  Hospitalized only for childbirth by Dr. Cherly Hensen in June 2000.  No chronic medications.  Had tetanus immunization July 2018.  A colonoscopy by Dr. Myrtie Neither in July 2018 with 10-year follow-up recommended.  Entire colon was normal.  Order placed for annual mammogram.  Had bone density study December 2017 lowest T score was -0.2 Will not repeat at this point in time.  Patient is menopausal.  No issues with hot flashes.  Generally feels well.  Social history: She is married.  2 adult sons.  She is employed as a Agricultural engineer.  Family history: Father deceased at age 23 of cancer.  Mother living in good health.  She has 6 brothers and 4 sisters.  Some of the brothers have diabetes and hypertension.          Review of Systems history of bunion right MTP joint.     Objective:   Physical Exam Blood pressure 140/90 pulse 61 pulse oximetry 96% weight 152 pounds BMI 27.80  Skin: Warm and dry.  No thyromegaly.  No JVD.  No carotid bruits.  No cervical adenopathy.  Chest is clear to auscultation without rales or wheezing.  Cardiac exam: Regular rate and rhythm, normal S1 and S2 without murmurs or gallops.  Breasts are without masses.  Pelvic exam: Pap smear taken in 2021.  No masses on bimanual exam.  Rectovaginal confirms.  Extremities: Small bunion right MTP joint currently not inflamed or red.  Neurological exam is intact without focal deficits.  Thought, judgment, affect are normal.       Assessment & Plan:  History of vitamin D deficiency.  Vitamin D level is now normal at 43 and a year ago was 21.  Mild elevation of SGOT at 41.  This was normal last year.  History of bunion right MTP joint  BMI 27-continue to work on  diet and exercise efforts  Immunizations are up-to-date including COVID-19 vaccines.  Plan: Return in 1 year or as needed.  Have annual mammogram.  Monitor BP at home and call if persistently elevated.

## 2020-08-25 NOTE — Patient Instructions (Addendum)
Immunizations are up to date. Have Covid vaccine in the Fall. Monitor BP at home and call if persistently elevated. It was a pleasure to see you today. Colonoscopy is up to date and mammogram is scheduled.  It was a pleasure to see you today.

## 2020-08-27 ENCOUNTER — Other Ambulatory Visit: Payer: Self-pay | Admitting: Internal Medicine

## 2020-09-19 ENCOUNTER — Other Ambulatory Visit: Payer: Self-pay

## 2020-09-19 ENCOUNTER — Ambulatory Visit
Admission: RE | Admit: 2020-09-19 | Discharge: 2020-09-19 | Disposition: A | Discharge status: Home / Self Care | Payer: PRIVATE HEALTH INSURANCE | Source: Ambulatory Visit | Attending: Internal Medicine | Admitting: Internal Medicine

## 2020-09-19 DIAGNOSIS — Z1231 Encounter for screening mammogram for malignant neoplasm of breast: Secondary | ICD-10-CM

## 2020-09-26 ENCOUNTER — Other Ambulatory Visit: Payer: No Typology Code available for payment source | Admitting: Internal Medicine

## 2020-09-26 ENCOUNTER — Other Ambulatory Visit: Payer: Self-pay

## 2020-09-26 DIAGNOSIS — R7989 Other specified abnormal findings of blood chemistry: Secondary | ICD-10-CM

## 2020-09-27 LAB — HEPATIC FUNCTION PANEL
AG Ratio: 1.3 (calc) (ref 1.0–2.5)
ALT: 20 U/L (ref 6–29)
AST: 23 U/L (ref 10–35)
Albumin: 4 g/dL (ref 3.6–5.1)
Alkaline phosphatase (APISO): 80 U/L (ref 37–153)
Bilirubin, Direct: 0.1 mg/dL (ref 0.0–0.2)
Globulin: 3.2 g/dL (calc) (ref 1.9–3.7)
Indirect Bilirubin: 0.3 mg/dL (calc) (ref 0.2–1.2)
Total Bilirubin: 0.4 mg/dL (ref 0.2–1.2)
Total Protein: 7.2 g/dL (ref 6.1–8.1)

## 2021-08-21 ENCOUNTER — Other Ambulatory Visit: Payer: No Typology Code available for payment source

## 2021-08-21 DIAGNOSIS — R5383 Other fatigue: Secondary | ICD-10-CM

## 2021-08-21 DIAGNOSIS — E781 Pure hyperglyceridemia: Secondary | ICD-10-CM

## 2021-08-21 DIAGNOSIS — R718 Other abnormality of red blood cells: Secondary | ICD-10-CM

## 2021-08-22 LAB — LIPID PANEL
Cholesterol: 231 mg/dL — ABNORMAL HIGH (ref <200)
HDL: 58 mg/dL (ref >=50)
LDL Cholesterol (Calc): 154 mg/dL (calc) — ABNORMAL HIGH
Non-HDL Cholesterol (Calc): 173 mg/dL (calc) — ABNORMAL HIGH (ref <130)
Total CHOL/HDL Ratio: 4 (calc) (ref <5.0)
Triglycerides: 85 mg/dL (ref <150)

## 2021-08-22 LAB — CBC WITH DIFFERENTIAL/PLATELET
Absolute Monocytes: 466 cells/uL (ref 200–950)
Basophils Absolute: 32 cells/uL (ref 0–200)
Basophils Relative: 0.5 %
Eosinophils Absolute: 132 cells/uL (ref 15–500)
Eosinophils Relative: 2.1 %
HCT: 38.6 % (ref 35.0–45.0)
Hemoglobin: 12 g/dL (ref 11.7–15.5)
Lymphs Abs: 3163 cells/uL (ref 850–3900)
MCH: 22 pg — ABNORMAL LOW (ref 27.0–33.0)
MCHC: 31.1 g/dL — ABNORMAL LOW (ref 32.0–36.0)
MCV: 70.8 fL — ABNORMAL LOW (ref 80.0–100.0)
MPV: 11.2 fL (ref 7.5–12.5)
Monocytes Relative: 7.4 %
Neutro Abs: 2507 cells/uL (ref 1500–7800)
Neutrophils Relative %: 39.8 %
Platelets: 294 10*3/uL (ref 140–400)
RBC: 5.45 10*6/uL — ABNORMAL HIGH (ref 3.80–5.10)
RDW: 15.5 % — ABNORMAL HIGH (ref 11.0–15.0)
Total Lymphocyte: 50.2 %
WBC: 6.3 10*3/uL (ref 3.8–10.8)

## 2021-08-22 LAB — COMPLETE METABOLIC PANEL WITH GFR
AG Ratio: 1.3 (calc) (ref 1.0–2.5)
ALT: 13 U/L (ref 6–29)
AST: 19 U/L (ref 10–35)
Albumin: 3.9 g/dL (ref 3.6–5.1)
Alkaline phosphatase (APISO): 83 U/L (ref 37–153)
BUN: 11 mg/dL (ref 7–25)
CO2: 27 mmol/L (ref 20–32)
Calcium: 9.5 mg/dL (ref 8.6–10.4)
Chloride: 108 mmol/L (ref 98–110)
Creat: 0.71 mg/dL (ref 0.50–1.03)
Globulin: 3.1 g/dL (calc) (ref 1.9–3.7)
Glucose, Bld: 88 mg/dL (ref 65–99)
Potassium: 4 mmol/L (ref 3.5–5.3)
Sodium: 142 mmol/L (ref 135–146)
Total Bilirubin: 0.5 mg/dL (ref 0.2–1.2)
Total Protein: 7 g/dL (ref 6.1–8.1)
eGFR: 98 mL/min/{1.73_m2} (ref >=60)

## 2021-08-22 LAB — TSH: TSH: 2.92 mIU/L (ref 0.40–4.50)

## 2021-08-24 ENCOUNTER — Other Ambulatory Visit: Payer: PRIVATE HEALTH INSURANCE | Admitting: Internal Medicine

## 2021-08-27 ENCOUNTER — Ambulatory Visit (INDEPENDENT_AMBULATORY_CARE_PROVIDER_SITE_OTHER): Payer: No Typology Code available for payment source | Admitting: Internal Medicine

## 2021-08-27 ENCOUNTER — Encounter: Payer: Self-pay | Admitting: Internal Medicine

## 2021-08-27 VITALS — BP 132/90 | HR 59 | Temp 98.9°F | Ht 60.5 in | Wt 151.8 lb

## 2021-08-27 DIAGNOSIS — Z0000 Encounter for general adult medical examination without abnormal findings: Secondary | ICD-10-CM | POA: Diagnosis not present

## 2021-08-27 DIAGNOSIS — E7800 Pure hypercholesterolemia, unspecified: Secondary | ICD-10-CM

## 2021-08-27 DIAGNOSIS — Z6829 Body mass index (BMI) 29.0-29.9, adult: Secondary | ICD-10-CM

## 2021-08-27 DIAGNOSIS — M21612 Bunion of left foot: Secondary | ICD-10-CM

## 2021-08-27 MED ORDER — ROSUVASTATIN CALCIUM 10 MG PO TABS: 10.0000 mg | ORAL_TABLET | Freq: Every day | ORAL | 3 refills | Status: DC

## 2021-08-27 NOTE — Progress Notes (Unsigned)
? ?  Subjective:  ? ? Patient ID: Maria Delacruz, female    DOB: August 16, 1960, 61 y.o.   MRN: [id number] ? ?HPI 61 year old Female seen for health maintenance exam and evaluation of medical issues. ? ? ? ?Review of Systems ? ?   ?Objective:  ? Physical Exam ? ? ? ? ?   ?Assessment & Plan:  ? ? ?

## 2021-08-28 ENCOUNTER — Other Ambulatory Visit: Payer: Self-pay | Admitting: Internal Medicine

## 2021-08-28 DIAGNOSIS — Z1231 Encounter for screening mammogram for malignant neoplasm of breast: Secondary | ICD-10-CM

## 2021-09-20 ENCOUNTER — Ambulatory Visit
Admission: RE | Admit: 2021-09-20 | Discharge: 2021-09-20 | Disposition: A | Discharge status: Home / Self Care | Payer: No Typology Code available for payment source | Source: Ambulatory Visit | Attending: Internal Medicine | Admitting: Internal Medicine

## 2021-12-25 ENCOUNTER — Other Ambulatory Visit: Payer: No Typology Code available for payment source

## 2021-12-25 DIAGNOSIS — E7800 Pure hypercholesterolemia, unspecified: Secondary | ICD-10-CM

## 2021-12-26 LAB — LIPID PANEL
Cholesterol: 157 mg/dL (ref <200)
HDL: 56 mg/dL (ref >=50)
LDL Cholesterol (Calc): 84 mg/dL (calc)
Non-HDL Cholesterol (Calc): 101 mg/dL (calc) (ref <130)
Total CHOL/HDL Ratio: 2.8 (calc) (ref <5.0)
Triglycerides: 81 mg/dL (ref <150)

## 2022-01-01 ENCOUNTER — Encounter: Payer: Self-pay | Admitting: Internal Medicine

## 2022-01-01 ENCOUNTER — Ambulatory Visit: Payer: No Typology Code available for payment source | Admitting: Internal Medicine

## 2022-01-01 VITALS — BP 130/82 | HR 61 | Temp 98.0°F | Ht 60.5 in | Wt 155.4 lb

## 2022-01-01 DIAGNOSIS — E7800 Pure hypercholesterolemia, unspecified: Secondary | ICD-10-CM

## 2022-01-01 DIAGNOSIS — Z23 Encounter for immunization: Secondary | ICD-10-CM | POA: Diagnosis not present

## 2022-01-01 DIAGNOSIS — Z6829 Body mass index (BMI) 29.0-29.9, adult: Secondary | ICD-10-CM | POA: Diagnosis not present

## 2022-01-01 NOTE — Progress Notes (Signed)
   Subjective:    Patient ID: Maria Delacruz, female    DOB: [birth year], 61 y.o.   MRN: [id number]  HPI pleasant 61 year old Female seen for follow-up today.  She was here for health maintenance exam in May.  At that time she was taking rosuvastatin 5 mg daily and her lipids were noted to be elevated with total cholesterol 231, HDL 58, triglycerides 85 and LDL cholesterol 154.  Rosuvastatin was increased to 10 mg daily and she is now here for follow-up.  Her general health is excellent.  She feels well.  Tolerating rosuvastatin without complaints.    Review of Systems no new complaints     Objective:   Physical Exam Blood sugar excellent 130/82 pulse 61 temperature 98 degrees pulse oximetry 96% weight 155 pounds 6.4 ounces BMI 29.85 Skin: Warm and dry.  No cervical adenopathy.  No thyromegaly.  No carotid bruits.  Chest is clear to auscultation.  Cardiac exam regular rate and rhythm without ectopy or murmur.  No lower extremity pitting edema.      Assessment & Plan:  Pure hypercholesterolemia-now normal lipid panel on rosuvastatin 10 mg daily.  Health maintenance-flu vaccine given and other vaccines discussed with her today.  BMI 29.85-continue to work on diet and exercise for weight loss.  Plan: Schedule health maintenance exam for May 2024.  COVID booster discussed with her.  She had pneumococcal vaccine in May.

## 2022-01-15 ENCOUNTER — Encounter: Payer: Self-pay | Admitting: Nurse Practitioner

## 2022-01-15 ENCOUNTER — Ambulatory Visit: Payer: No Typology Code available for payment source | Admitting: Nurse Practitioner

## 2022-01-15 VITALS — BP 136/76 | HR 73

## 2022-01-15 DIAGNOSIS — Z789 Other specified health status: Secondary | ICD-10-CM

## 2022-01-15 NOTE — Progress Notes (Signed)
   Subjective:  Patient ID: Maria Delacruz, female    DOB: 12-Jun-1960  Age: 61 y.o. MRN: [id number]  CC: Wellness Exam   HPI Maria Delacruz presents for wellness exam visit for insurance benefit.  Patient has a PCP: Dr. Tedra Senegal PMH significant for: HTN and Hypercholesteremia  Last labs per PCP were completed: May 2023  Health Maintenance:  Colonoscopy: completed 2010, repeat 2028 Mammogram: June 2023 DEXA: 2017     Smoker: Never  Immunizations:  Shingrix-  Has plans to complete this next week.  COVID- x4 Flu: completed 2 weeks ago.   Lifestyle: Diet- no specific diet.  Exercise- reports very active at work, but no exercise routine.      Past Medical History:  Diagnosis Date   Hypertension 04/2016   Due to stress.   Leg pain, bilateral    Sciatica     Past Surgical History:  Procedure Laterality Date   CESAREAN SECTION     twice   MYOMECTOMY      Outpatient Medications Prior to Visit  Medication Sig Dispense Refill   Multiple Vitamins-Minerals (ONE-A-DAY WOMENS 50 PLUS PO) Take by mouth daily.     rosuvastatin (CRESTOR) 10 MG tablet Take 1 tablet (10 mg total) by mouth daily. 90 tablet 3   No facility-administered medications prior to visit.    ROS Review of Systems  Respiratory:  Negative for shortness of breath.   Cardiovascular:  Negative for chest pain and leg swelling.  Gastrointestinal:  Negative for constipation and diarrhea.  Musculoskeletal:  Negative for arthralgias.  Neurological:  Negative for headaches.    Objective:  BP 136/76   Pulse 73   SpO2 99%   Physical Exam Constitutional:      General: She is not in acute distress. HENT:     Head: Normocephalic.  Cardiovascular:     Rate and Rhythm: Normal rate and regular rhythm.     Pulses: Normal pulses.     Heart sounds: Normal heart sounds.  Pulmonary:     Effort: Pulmonary effort is normal.     Breath sounds: Normal breath sounds.  Musculoskeletal:        General:  Normal range of motion.     Right lower leg: No edema.     Left lower leg: No edema.  Skin:    General: Skin is warm.  Neurological:     General: No focal deficit present.     Mental Status: She is alert and oriented to person, place, and time.  Psychiatric:        Mood and Affect: Mood normal.        Behavior: Behavior normal.      Assessment & Plan:    Maria Delacruz was seen today for wellness exam and wellness exam.  Diagnoses and all orders for this visit:  Participant in health and wellness plan Adult wellness physical was conducted today. Importance of diet and exercise were discussed in detail.  We reviewed immunizations and gave recommendations regarding current immunization needed for age.  Patient plans to obtain Shingrix vaccine via [employer].  Preventative health exams are up to date.  Patient was advised yearly wellness exam and follow-up with PCP as recommended.  Continue to monitor BP at home.     No orders of the defined types were placed in this encounter.   No orders of the defined types were placed in this encounter.   Follow-up: As needed.

## 2022-01-16 NOTE — Patient Instructions (Addendum)
Flu vaccine given.  Lipid panel completely normal on rosuvastatin 10 mg daily.  Return in May 2024 for health maintenance exam.  She had pneumococcal vaccine in May.  COVID booster discussed with her.

## 2022-06-27 IMAGING — MG MM DIGITAL SCREENING BILAT W/ TOMO AND CAD
8 series · 9 of 24 positions shown · non-contrast
Comparison: Previous exam(s).

CLINICAL DATA: Screening.

EXAM:
DIGITAL SCREENING BILATERAL MAMMOGRAM WITH TOMOSYNTHESIS AND CAD
TECHNIQUE: Bilateral screening digital craniocaudal and mediolateral oblique
mammograms were obtained. Bilateral screening digital breast
tomosynthesis was performed. The images were evaluated with
computer-aided detection.

[L MLO synth-2D]
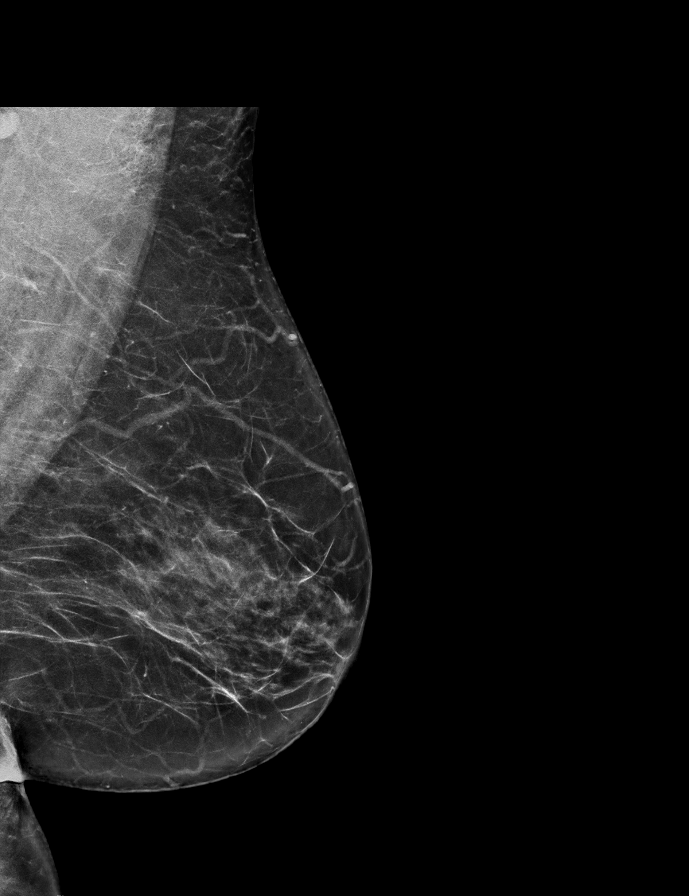

[R MLO synth-2D]
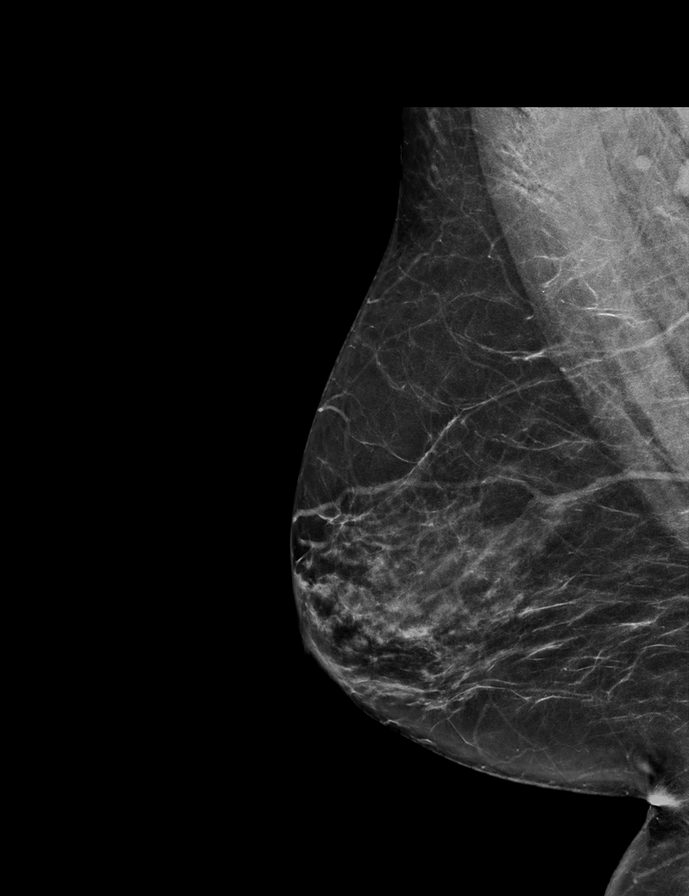

[L CC synth-2D]
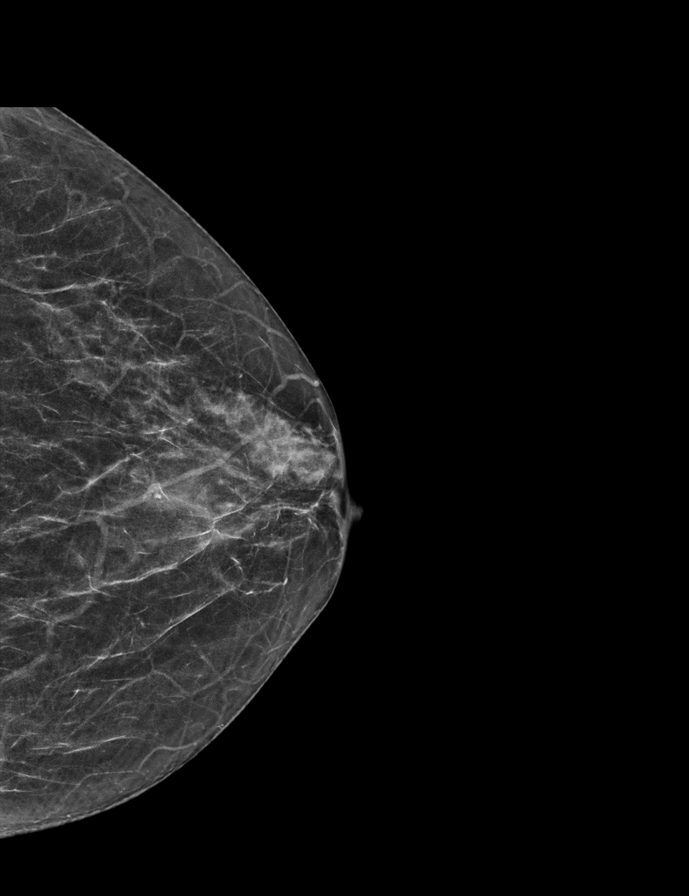

[R CC synth-2D]
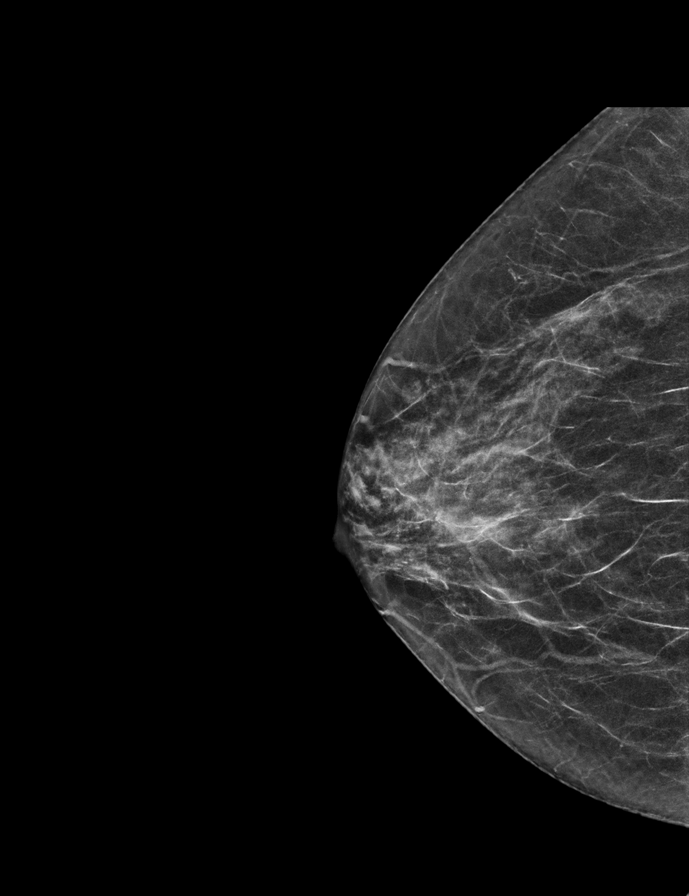

[R CC tomo · 2 of 52 frames shown]
[frame 17/52]
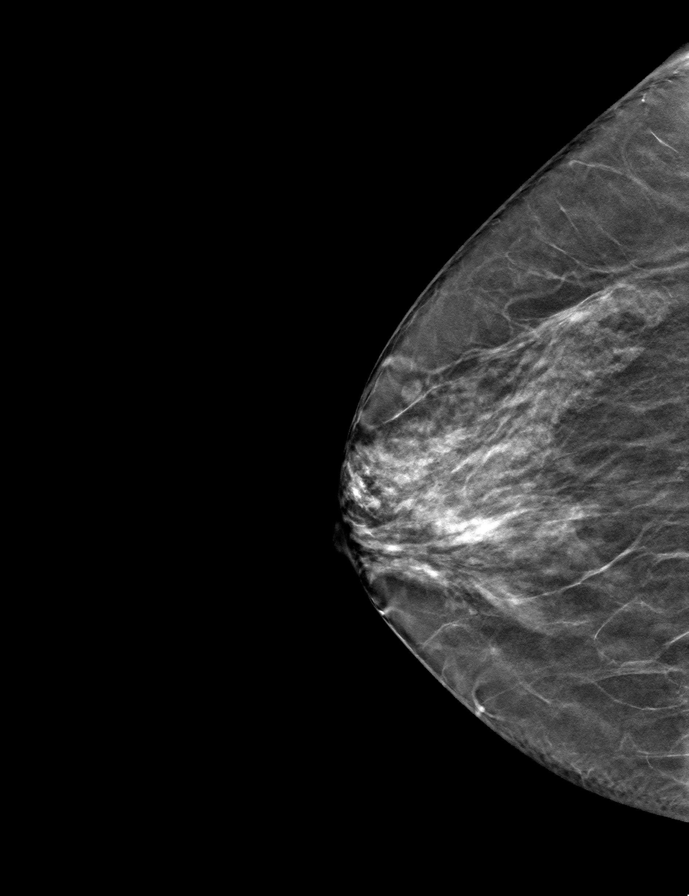
[frame 27/52]
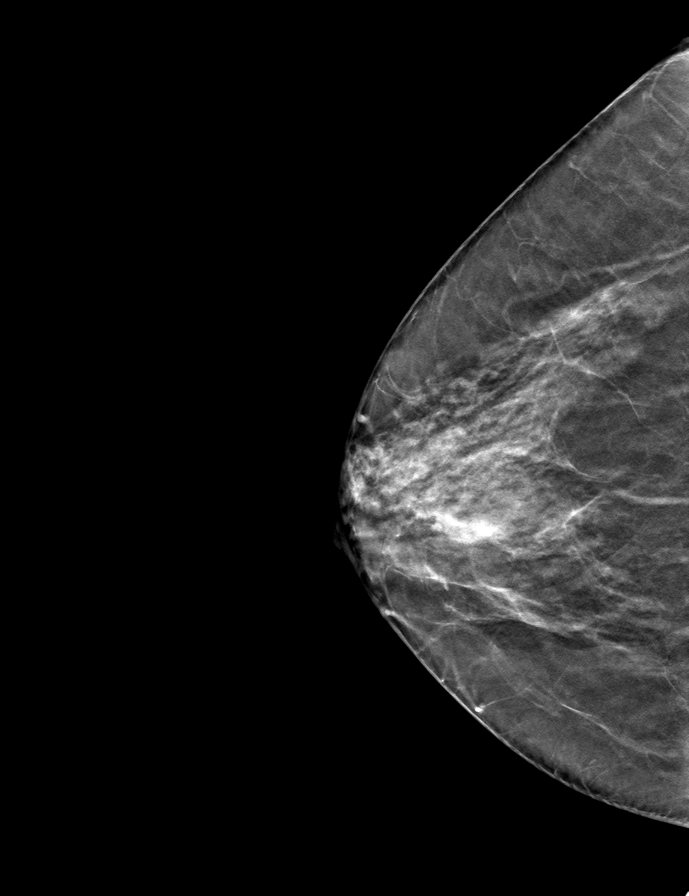

[L MLO tomo · tomo slice 33/64.0]
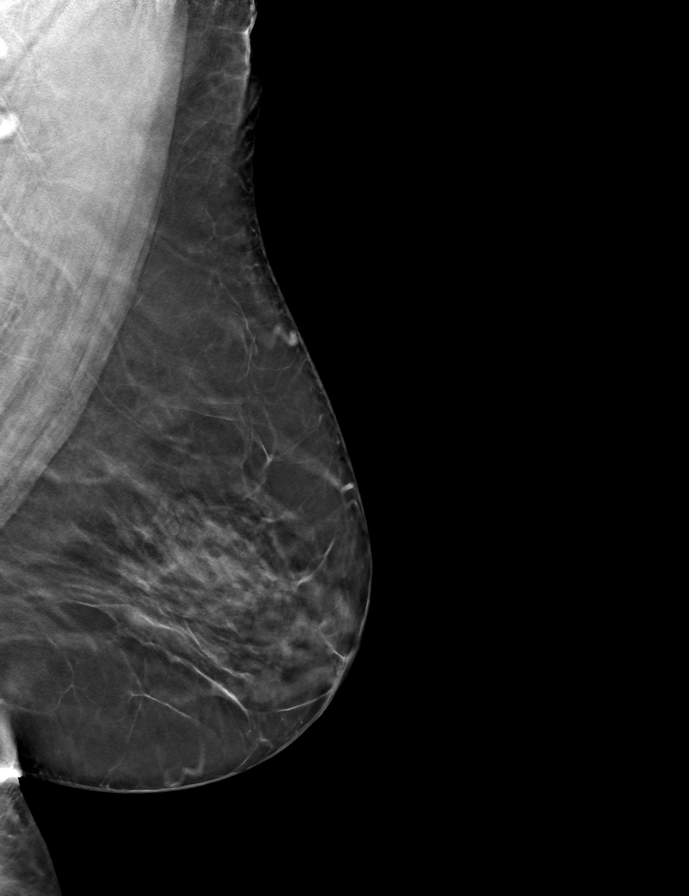

[R MLO tomo · tomo slice 31/60.0]
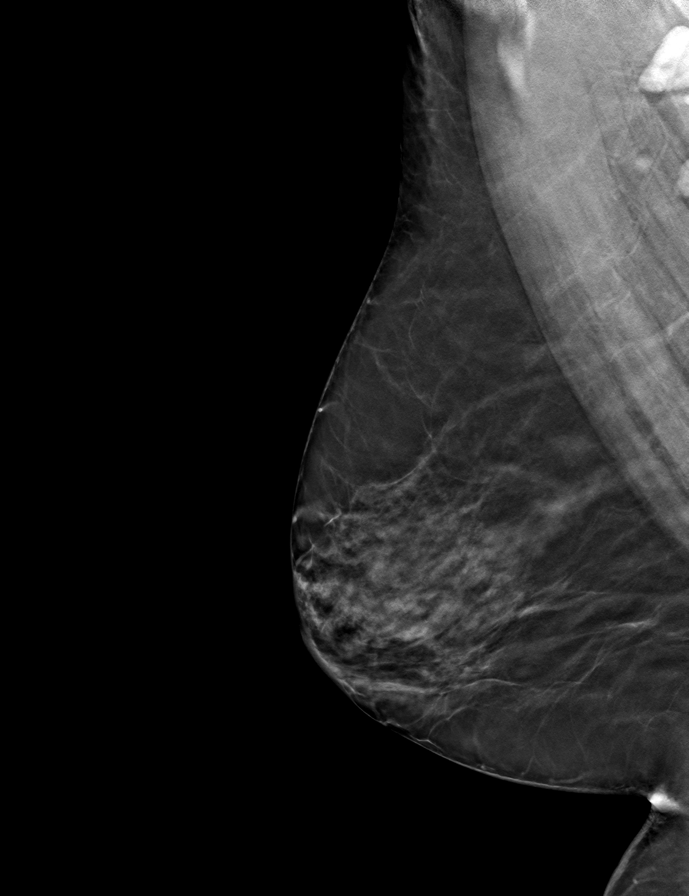

[L CC tomo · tomo slice 25/48.0]
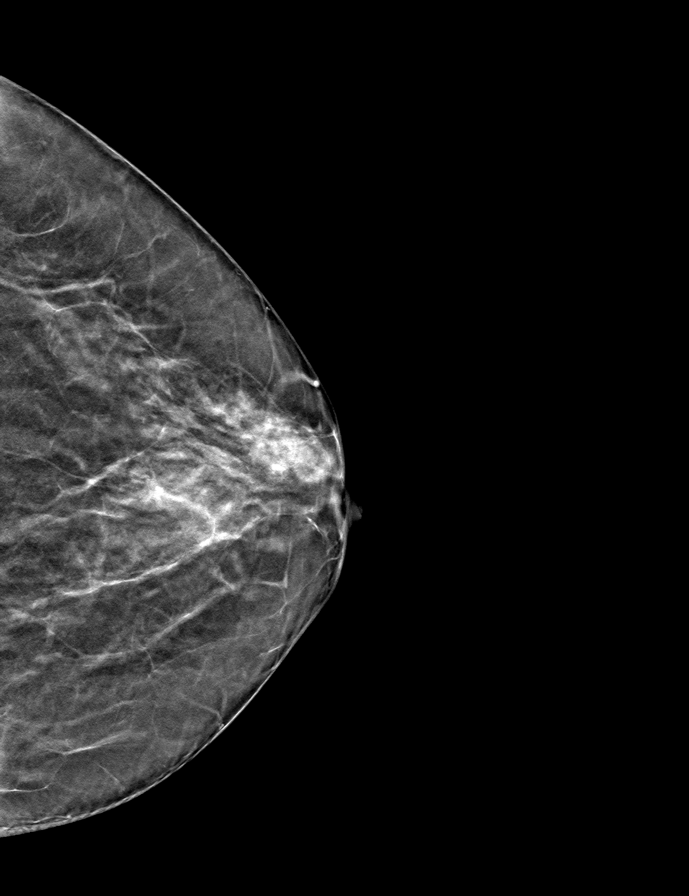

[9 of 24 positions shown; findings below may reference images not displayed]

ACR Breast Density Category c: The breast tissue is heterogeneously
dense, which may obscure small masses.
FINDINGS: There are no findings suspicious for malignancy.
IMPRESSION: No mammographic evidence of malignancy. A result letter of this
screening mammogram will be mailed directly to the patient.

RECOMMENDATION:
Screening mammogram in one year. (Code:Q3-W-BC3)

BI-RADS CATEGORY  1: Negative.

## 2022-07-30 ENCOUNTER — Other Ambulatory Visit: Payer: Self-pay | Admitting: Internal Medicine

## 2022-08-12 ENCOUNTER — Other Ambulatory Visit: Payer: Self-pay | Admitting: Internal Medicine

## 2022-08-12 DIAGNOSIS — Z1231 Encounter for screening mammogram for malignant neoplasm of breast: Secondary | ICD-10-CM

## 2022-08-22 NOTE — Progress Notes (Signed)
Patient Care Team: Margaree Mackintosh, MD as PCP - General (Internal Medicine)  Visit Date: 08/29/22  Subjective:    Patient ID: Maria Delacruz , Female   DOB: August 04, 1960, 62 y.o.    MRN: [id number]   62 y.o. Female presents today for annual comprehensive physical exam. Patient has a past medical history of hypertension, sciatica.  History of hyperlipidemia treated with rosuvastatin 10 mg daily. Lipid panel normal on 08/26/22.  TSH at 4.49. Glucose normal. Kidney, liver function normal. Electrolytes normal. Blood proteins normal. RBC elevated at 5.73, MCV low at 70.7, MCH low at 21.6, MCHC low at 30.6, RDW elevated at 16.  Pap smear last completed 08/25/19. Negative for intraepithelial lesion or malignancy.  Mammogram last completed 09/20/21. No mammographic evidence of malignancy. Repeat scheduled for 09/24/22.  Colonoscopy last completed 10/24/16. Entire examined colon was normal. Recommended repeat in 2028.  Past Medical History:  Diagnosis Date   Hypertension 04/2016   Due to stress.   Leg pain, bilateral    Sciatica      Family History  Problem Relation Age of Onset   Hypertension Mother    Throat cancer Father    Hypertension Brother    Hypertension Brother    Colon cancer Neg Hx    Stomach cancer Neg Hx     Social History   Social History Narrative   Not on file      Review of Systems  Constitutional:  Negative for chills, fever, malaise/fatigue and weight loss.  HENT:  Negative for hearing loss, sinus pain and sore throat.   Respiratory:  Negative for cough, hemoptysis and shortness of breath.   Cardiovascular:  Negative for chest pain, palpitations, leg swelling and PND.  Gastrointestinal:  Negative for abdominal pain, constipation, diarrhea, heartburn, nausea and vomiting.  Genitourinary:  Negative for dysuria, frequency and urgency.  Musculoskeletal:  Negative for back pain, myalgias and neck pain.  Skin:  Negative for itching and rash.  Neurological:   Negative for dizziness, tingling, seizures and headaches.  Endo/Heme/Allergies:  Negative for polydipsia.  Psychiatric/Behavioral:  Negative for depression. The patient is not nervous/anxious.         Objective:   Vitals: BP 130/80   Pulse 64   Temp 97.9 F (36.6 C) (Tympanic)   Ht 5\' 1"  (1.549 m)   Wt 154 lb (69.9 kg)   SpO2 97%   BMI 29.10 kg/m    Physical Exam Vitals and nursing note reviewed.  Constitutional:      General: She is not in acute distress.    Appearance: Normal appearance. She is not ill-appearing or toxic-appearing.  HENT:     Head: Normocephalic and atraumatic.     Right Ear: Hearing, tympanic membrane and external ear normal. There is impacted cerumen.     Left Ear: Hearing, tympanic membrane, ear canal and external ear normal.     Mouth/Throat:     Pharynx: Oropharynx is clear.  Eyes:     Extraocular Movements: Extraocular movements intact.     Pupils: Pupils are equal, round, and reactive to light.  Neck:     Thyroid: No thyroid mass, thyromegaly or thyroid tenderness.     Vascular: No carotid bruit.  Cardiovascular:     Rate and Rhythm: Normal rate and regular rhythm. No extrasystoles are present.    Pulses:          Dorsalis pedis pulses are 1+ on the right side and 1+ on the left side.  Heart sounds: Normal heart sounds. No murmur heard.    No friction rub. No gallop.  Pulmonary:     Effort: Pulmonary effort is normal.     Breath sounds: Normal breath sounds. No decreased breath sounds, wheezing, rhonchi or rales.  Chest:     Chest wall: No mass.  Abdominal:     Palpations: Abdomen is soft. There is no hepatomegaly, splenomegaly or mass.     Tenderness: There is no abdominal tenderness.     Hernia: No hernia is present.  Genitourinary:    Comments: NFEG. No masses on vaginal exam. Rectovaginal confirms. No stool. Pap smear taken. Musculoskeletal:     Cervical back: Normal range of motion.     Right lower leg: No edema.     Left  lower leg: No edema.  Lymphadenopathy:     Cervical: No cervical adenopathy.     Upper Body:     Right upper body: No supraclavicular adenopathy.     Left upper body: No supraclavicular adenopathy.  Skin:    General: Skin is warm and dry.  Neurological:     General: No focal deficit present.     Mental Status: She is alert and oriented to person, place, and time. Mental status is at baseline.     Sensory: Sensation is intact.     Motor: Motor function is intact. No weakness.     Deep Tendon Reflexes: Reflexes are normal and symmetric.  Psychiatric:        Attention and Perception: Attention normal.        Mood and Affect: Mood normal.        Speech: Speech normal.        Behavior: Behavior normal.        Thought Content: Thought content normal.        Cognition and Memory: Cognition normal.        Judgment: Judgment normal.       Results:   Studies obtained and personally reviewed by me:  Pap smear last completed 08/25/19. Negative for intraepithelial lesion or malignancy. Recommended repeat in 2024.  Mammogram last completed 09/20/21. No mammographic evidence of malignancy. Repeat scheduled for 09/24/22.  Colonoscopy last completed 10/24/16. Entire examined colon was normal. Recommended repeat in 2028.   Labs:       Component Value Date/Time   NA 141 08/26/2022 0903   K 4.1 08/26/2022 0903   CL 106 08/26/2022 0903   CO2 27 08/26/2022 0903   GLUCOSE 87 08/26/2022 0903   BUN 9 08/26/2022 0903   CREATININE 0.84 08/26/2022 0903   CALCIUM 9.7 08/26/2022 0903   PROT 7.5 08/26/2022 0903   AST 24 08/26/2022 0903   ALT 18 08/26/2022 0903   BILITOT 0.4 08/26/2022 0903   GFRNONAA 81 08/18/2020 1129   GFRAA 94 08/18/2020 1129     Lab Results  Component Value Date   WBC 6.9 08/26/2022   HGB 12.4 08/26/2022   HCT 40.5 08/26/2022   MCV 70.7 (L) 08/26/2022   PLT 296 08/26/2022    Lab Results  Component Value Date   CHOL 159 08/26/2022   HDL 67 08/26/2022   LDLCALC 77  08/26/2022   TRIG 72 08/26/2022   CHOLHDL 2.4 08/26/2022    No results found for: "HGBA1C"   Lab Results  Component Value Date   TSH 4.49 08/26/2022      Assessment & Plan:   Hyperlipidemia: controlled with rosuvastatin 10 mg daily. Lipid panel normal on 08/26/22.  Elevated TSH: TSH at 4.49. Will recheck this in 3 months.  Pap smear: last completed 08/25/19. Negative for intraepithelial lesion or malignancy.  Mammogram: last completed 09/20/21. No mammographic evidence of malignancy. Repeat scheduled for 09/24/22.  Colonoscopy: last completed 10/24/16. Entire examined colon was normal. Recommended repeat in 2028.  Vaccine counseling: vaccines discussed. UTD on flu, tetanus vaccines.  Return for TSH recheck in 3 months, no office visit.    I,Alexander Ruley,acting as a Neurosurgeon for Margaree Mackintosh, MD.,have documented all relevant documentation on the behalf of Margaree Mackintosh, MD,as directed by  Margaree Mackintosh, MD while in the presence of Margaree Mackintosh, MD.   I, Margaree Mackintosh, MD, have reviewed all documentation for this visit. The documentation on 10/05/22 for the exam, diagnosis, procedures, and orders are all accurate and complete.

## 2022-08-26 ENCOUNTER — Other Ambulatory Visit: Payer: No Typology Code available for payment source

## 2022-08-26 DIAGNOSIS — Z1329 Encounter for screening for other suspected endocrine disorder: Secondary | ICD-10-CM

## 2022-08-26 DIAGNOSIS — Z136 Encounter for screening for cardiovascular disorders: Secondary | ICD-10-CM

## 2022-08-26 DIAGNOSIS — E7800 Pure hypercholesterolemia, unspecified: Secondary | ICD-10-CM

## 2022-08-27 LAB — COMPLETE METABOLIC PANEL WITH GFR
AG Ratio: 1.2 (calc) (ref 1.0–2.5)
ALT: 18 U/L (ref 6–29)
AST: 24 U/L (ref 10–35)
Albumin: 4.1 g/dL (ref 3.6–5.1)
Alkaline phosphatase (APISO): 82 U/L (ref 37–153)
BUN: 9 mg/dL (ref 7–25)
CO2: 27 mmol/L (ref 20–32)
Calcium: 9.7 mg/dL (ref 8.6–10.4)
Chloride: 106 mmol/L (ref 98–110)
Creat: 0.84 mg/dL (ref 0.50–1.05)
Globulin: 3.4 g/dL (calc) (ref 1.9–3.7)
Glucose, Bld: 87 mg/dL (ref 65–99)
Potassium: 4.1 mmol/L (ref 3.5–5.3)
Sodium: 141 mmol/L (ref 135–146)
Total Bilirubin: 0.4 mg/dL (ref 0.2–1.2)
Total Protein: 7.5 g/dL (ref 6.1–8.1)
eGFR: 80 mL/min/{1.73_m2} (ref >=60)

## 2022-08-27 LAB — LIPID PANEL
Cholesterol: 159 mg/dL (ref <200)
HDL: 67 mg/dL (ref >=50)
LDL Cholesterol (Calc): 77 mg/dL (calc)
Non-HDL Cholesterol (Calc): 92 mg/dL (calc) (ref <130)
Total CHOL/HDL Ratio: 2.4 (calc) (ref <5.0)
Triglycerides: 72 mg/dL (ref <150)

## 2022-08-27 LAB — CBC WITH DIFFERENTIAL/PLATELET
Absolute Monocytes: 531 cells/uL (ref 200–950)
Basophils Absolute: 21 cells/uL (ref 0–200)
Basophils Relative: 0.3 %
Eosinophils Absolute: 83 cells/uL (ref 15–500)
Eosinophils Relative: 1.2 %
HCT: 40.5 % (ref 35.0–45.0)
Hemoglobin: 12.4 g/dL (ref 11.7–15.5)
Lymphs Abs: 3705 cells/uL (ref 850–3900)
MCH: 21.6 pg — ABNORMAL LOW (ref 27.0–33.0)
MCHC: 30.6 g/dL — ABNORMAL LOW (ref 32.0–36.0)
MCV: 70.7 fL — ABNORMAL LOW (ref 80.0–100.0)
MPV: 11.3 fL (ref 7.5–12.5)
Monocytes Relative: 7.7 %
Neutro Abs: 2560 cells/uL (ref 1500–7800)
Neutrophils Relative %: 37.1 %
Platelets: 296 10*3/uL (ref 140–400)
RBC: 5.73 10*6/uL — ABNORMAL HIGH (ref 3.80–5.10)
RDW: 16 % — ABNORMAL HIGH (ref 11.0–15.0)
Total Lymphocyte: 53.7 %
WBC: 6.9 10*3/uL (ref 3.8–10.8)

## 2022-08-27 LAB — TSH: TSH: 4.49 mIU/L (ref 0.40–4.50)

## 2022-08-29 ENCOUNTER — Ambulatory Visit: Payer: No Typology Code available for payment source | Admitting: Internal Medicine

## 2022-08-29 ENCOUNTER — Other Ambulatory Visit (HOSPITAL_COMMUNITY)
Admission: RE | Admit: 2022-08-29 | Discharge: 2022-08-29 | Disposition: A | Discharge status: Home / Self Care | Payer: No Typology Code available for payment source | Source: Ambulatory Visit | Attending: Internal Medicine | Admitting: Internal Medicine

## 2022-08-29 ENCOUNTER — Other Ambulatory Visit: Payer: Self-pay

## 2022-08-29 ENCOUNTER — Encounter: Payer: Self-pay | Admitting: Internal Medicine

## 2022-08-29 VITALS — BP 130/80 | HR 64 | Temp 97.9°F | Ht 61.0 in | Wt 154.0 lb

## 2022-08-29 DIAGNOSIS — Z0000 Encounter for general adult medical examination without abnormal findings: Secondary | ICD-10-CM | POA: Insufficient documentation

## 2022-08-29 DIAGNOSIS — E7800 Pure hypercholesterolemia, unspecified: Secondary | ICD-10-CM | POA: Diagnosis not present

## 2022-08-29 DIAGNOSIS — R7989 Other specified abnormal findings of blood chemistry: Secondary | ICD-10-CM | POA: Diagnosis not present

## 2022-08-29 DIAGNOSIS — Z6829 Body mass index (BMI) 29.0-29.9, adult: Secondary | ICD-10-CM

## 2022-08-29 LAB — POCT URINALYSIS DIPSTICK
Bilirubin, UA: NEGATIVE
Blood, UA: NEGATIVE
Glucose, UA: NEGATIVE
Ketones, UA: NEGATIVE
Leukocytes, UA: NEGATIVE
Nitrite, UA: NEGATIVE
Protein, UA: NEGATIVE
Spec Grav, UA: 1.015 (ref 1.010–1.025)
Urobilinogen, UA: 0.2 E.U./dL
pH, UA: 5 (ref 5.0–8.0)

## 2022-08-29 MED ORDER — ROSUVASTATIN CALCIUM 10 MG PO TABS: 10.0000 mg | ORAL_TABLET | Freq: Every day | ORAL | 0 refills | Status: DC

## 2022-08-30 LAB — CYTOLOGY - PAP
Comment: NEGATIVE
Diagnosis: NEGATIVE
High risk HPV: NEGATIVE

## 2022-09-24 ENCOUNTER — Ambulatory Visit
Admission: RE | Admit: 2022-09-24 | Discharge: 2022-09-24 | Disposition: A | Discharge status: Home / Self Care | Payer: No Typology Code available for payment source | Source: Ambulatory Visit | Attending: Internal Medicine | Admitting: Internal Medicine

## 2022-09-24 DIAGNOSIS — Z1231 Encounter for screening mammogram for malignant neoplasm of breast: Secondary | ICD-10-CM

## 2022-10-05 DIAGNOSIS — E785 Hyperlipidemia, unspecified: Secondary | ICD-10-CM | POA: Insufficient documentation

## 2022-10-05 NOTE — Patient Instructions (Signed)
It was a pleasure to see you today.  Continue rosuvastatin 10 mg daily.  Your TSH is slightly elevated at 4.49.  You could be developing early hypothyroidism.  We will recheck this in 3 months without office visit.  It will be a lab only and you do not have to fast.  Vaccines discussed.  Next physical exam in 1 year.

## 2022-12-05 ENCOUNTER — Other Ambulatory Visit: Payer: No Typology Code available for payment source

## 2022-12-05 DIAGNOSIS — R7989 Other specified abnormal findings of blood chemistry: Secondary | ICD-10-CM

## 2022-12-06 LAB — TSH: TSH: 3.8 m[IU]/L (ref 0.40–4.50)

## 2023-01-14 ENCOUNTER — Ambulatory Visit: Payer: No Typology Code available for payment source | Admitting: Nurse Practitioner

## 2023-01-14 ENCOUNTER — Encounter: Payer: Self-pay | Admitting: Nurse Practitioner

## 2023-01-14 VITALS — BP 134/80 | HR 90

## 2023-01-14 DIAGNOSIS — Z789 Other specified health status: Secondary | ICD-10-CM

## 2023-01-14 NOTE — Progress Notes (Signed)
[facility]  Subjective:  Patient ID: Maria Delacruz, female    DOB: 06-02-1960  Age: 62 y.o. MRN: [id number]  CC: Wellness Exam   HPI Maria Delacruz presents for wellness exam visit for insurance benefit.  Patient has a PCP: Dr. Lenord Fellers PMH significant for: HTN and HLD Last labs per PCP were completed; May 2024  Health Maintenance:  Colonoscopy: Due 2028 Mammogram: June 2024 Pap: completed 2024    Smoker: never  Immunizations:  Shingrix-  completed COVID- x4 Tdap- due 2028 Flu- yearly  Lifestyle: Diet- no particular diet Exercise- active at work.     Past Medical History:  Diagnosis Date   Hypertension 04/2016   Due to stress.   Leg pain, bilateral    Sciatica     Past Surgical History:  Procedure Laterality Date   CESAREAN SECTION     twice   MYOMECTOMY      Outpatient Medications Prior to Visit  Medication Sig Dispense Refill   Multiple Vitamins-Minerals (ONE-A-DAY WOMENS 50 PLUS PO) Take by mouth daily.     rosuvastatin (CRESTOR) 10 MG tablet Take 1 tablet (10 mg total) by mouth daily. 90 tablet 0   No facility-administered medications prior to visit.    ROS Review of Systems  Constitutional:  Negative for fatigue.  HENT:  Negative for hearing loss.   Eyes:  Negative for visual disturbance.  Respiratory:  Negative for shortness of breath.   Cardiovascular:  Negative for chest pain and leg swelling.  Gastrointestinal:  Negative for constipation, diarrhea, nausea and vomiting.  Musculoskeletal:  Negative for arthralgias, back pain and joint swelling.  Neurological:  Negative for headaches.  Psychiatric/Behavioral:  Negative for sleep disturbance.     Objective:  BP 134/80 (BP Location: Left Arm, Patient Position: Sitting)   Pulse 90   SpO2 97%   Physical Exam Constitutional:      General: She is not in acute distress. HENT:     Head: Normocephalic.     Right Ear: Tympanic membrane, ear canal and external ear  normal.     Left Ear: Tympanic membrane, ear canal and external ear normal.     Mouth/Throat:     Pharynx: No oropharyngeal exudate or posterior oropharyngeal erythema.  Eyes:     Pupils: Pupils are equal, round, and reactive to light.  Cardiovascular:     Rate and Rhythm: Normal rate and regular rhythm.     Heart sounds: Normal heart sounds.  Pulmonary:     Effort: Pulmonary effort is normal.  Abdominal:     General: Bowel sounds are normal.     Palpations: Abdomen is soft.  Musculoskeletal:        General: Normal range of motion.     Right lower leg: No edema.     Left lower leg: No edema.  Skin:    General: Skin is warm.  Neurological:     General: No focal deficit present.     Mental Status: She is alert and oriented to person, place, and time.  Psychiatric:        Mood and Affect: Mood normal.        Behavior: Behavior normal.      Assessment & Plan:    Maria Delacruz was seen today for wellness exam.  Diagnoses and all orders for this visit:  Participant in health and wellness plan Adult wellness physical was conducted today. Importance of diet and exercise were discussed in detail.  We reviewed immunizations and gave  recommendations regarding current immunization needed for age.  Preventative health exams are up to date.   Patient was advised yearly wellness exam and follow-up with PCP as needed.     No orders of the defined types were placed in this encounter.   No orders of the defined types were placed in this encounter.   Follow-up: as needed

## 2023-01-31 ENCOUNTER — Other Ambulatory Visit: Payer: Self-pay | Admitting: Internal Medicine

## 2023-01-31 DIAGNOSIS — E7800 Pure hypercholesterolemia, unspecified: Secondary | ICD-10-CM

## 2023-01-31 MED ORDER — ROSUVASTATIN CALCIUM 10 MG PO TABS: 10.0000 mg | ORAL_TABLET | Freq: Every day | ORAL | 2 refills | Status: DC

## 2023-01-31 MED ORDER — ROSUVASTATIN CALCIUM 10 MG PO TABS
10.0000 mg | ORAL_TABLET | Freq: Every day | ORAL | 3 refills | Status: DC
Start: 2023-01-31 — End: 2023-09-02

## 2023-01-31 NOTE — Addendum Note (Signed)
Addended by: Gregery Na on: 01/31/2023 02:21 PM   Modules accepted: Orders

## 2023-01-31 NOTE — Addendum Note (Signed)
Addended by: Gregery Na on: 01/31/2023 10:40 AM   Modules accepted: Orders

## 2023-01-31 NOTE — Addendum Note (Signed)
Addended by: Gregery Na on: 01/31/2023 02:14 PM   Modules accepted: Orders

## 2023-08-11 ENCOUNTER — Other Ambulatory Visit: Payer: Self-pay | Admitting: Internal Medicine

## 2023-08-11 DIAGNOSIS — Z0000 Encounter for general adult medical examination without abnormal findings: Secondary | ICD-10-CM

## 2023-08-20 NOTE — Progress Notes (Signed)
 Annual Wellness Visit   Patient Care Team: Sylvan Evener, MD as PCP - General (Internal Medicine)  Visit Date: 09/02/23   Chief Complaint  Patient presents with   Annual Exam   Subjective:  Patient: Maria Delacruz, Female DOB: [birth year], 63 y.o. MRN: [id number] Maria Delacruz is a 63 y.o. Female who presents today for her Annual Wellness Visit. Patient has Hyperlipidemia.  History of Hyperlipidemia treated with Rosuvastatin  10 mg daily. 09/01/2023 Lipid Panel: WNL.    Labs 09/01/2023 CBC, compared to 2024: RBC 5.42, decreased from 5.73; MCV 72, elevated from 70.7; MCH 22, elevated from 21.6; MCHC 30.5, decreased from 30.6; RDW 15.8, decreased from 16; otherwise WNL.  CMP, compared to 08/2022: Glucose 100, elevated from 87; otherwise WNL.   TSH: 3.98  PAP Smear 08/29/2022  normal  with repeat recommendation of 2027.  Mammogram 09/24/2022  normal  with repeat scheduled 09/25/2023.  Colonoscopy 10/24/2016  normal  with repeat recommendation of 2028.  Bone Density will be due 2028.   Vaccine Counseling: Due for Covid-19 and Shingles 2/2; UTD on Flu and Tdap. Past Medical History:  Diagnosis Date   Hypertension 04/2016   Due to stress.   Leg pain, bilateral    Sciatica    Medical/Surgical History Narrative:   Allergic/Intolerant to: No Known Allergies  2000 - In June hospitalized for childbirth/c-section by Dr. Lesta Rater.   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 Married. 2 adult sons. Traveling to [place] Summer 2025   Review of Systems  Constitutional:  Negative for chills, fever, malaise/fatigue and weight loss.  HENT:  Negative for hearing loss, sinus pain and sore throat.   Respiratory:  Negative for cough, hemoptysis and shortness of breath.   Cardiovascular:  Negative for chest pain, palpitations, leg swelling and PND.  Gastrointestinal:  Negative for abdominal pain, constipation, diarrhea, heartburn, nausea and vomiting.  Genitourinary:  Negative for dysuria, frequency and urgency.  Musculoskeletal:  Negative for back pain, myalgias and neck pain.  Skin:  Negative for itching and rash.  Neurological:  Negative for dizziness, tingling, seizures and headaches.  Endo/Heme/Allergies:  Negative for polydipsia.  Psychiatric/Behavioral:  Negative for depression. The patient is not nervous/anxious.     Objective:  Vitals: BP 130/80   Pulse 64   Ht 5' 1.5" (1.562 m)   Wt 158 lb (71.7 kg)   SpO2 97%   BMI 29.37 kg/m  Physical Exam Vitals and nursing note reviewed.  Constitutional:      General: She is not in acute distress.    Appearance: Normal appearance. She is not ill-appearing or toxic-appearing.  HENT:     Head: Normocephalic and atraumatic.     Right Ear: Hearing, tympanic membrane, ear canal and external ear normal.     Left Ear: Hearing, tympanic membrane, ear canal and  external ear normal.     Mouth/Throat:     Pharynx: Oropharynx is clear.  Eyes:     Extraocular Movements: Extraocular movements intact.     Pupils: Pupils are equal, round, and reactive to light.  Neck:     Thyroid: No thyroid mass, thyromegaly or thyroid tenderness.     Vascular: No carotid bruit.  Cardiovascular:     Rate and Rhythm: Normal rate and regular rhythm. No extrasystoles are present.    Pulses:          Dorsalis pedis pulses are 2+ on the right side and 2+ on the left side.     Heart sounds:  Normal heart sounds. No murmur heard.    No friction rub. No gallop.  Pulmonary:     Effort: Pulmonary effort is normal.     Breath sounds: Normal breath sounds. No decreased breath sounds, wheezing, rhonchi or rales.  Chest:     Chest wall: No mass.  Abdominal:     Palpations: Abdomen is soft. There is no hepatomegaly, splenomegaly or mass.     Tenderness: There is no abdominal tenderness.     Hernia: No hernia is present.  Genitourinary:    Adnexa: Right adnexa normal and left adnexa normal.     Rectum: Guaiac result negative.  Musculoskeletal:     Cervical back: Normal range of motion.     Right lower leg: No edema.     Left lower leg: No edema.  Lymphadenopathy:     Cervical: No cervical adenopathy.     Upper Body:     Right upper body: No supraclavicular adenopathy.     Left upper body: No supraclavicular adenopathy.  Skin:    General: Skin is warm and dry.  Neurological:     General: No focal deficit present.     Mental Status: She is alert and oriented to person, place, and time. Mental status is at baseline.     Sensory: Sensation is intact.     Motor: Motor function is intact. No weakness.     Deep Tendon Reflexes: Reflexes are normal and symmetric.  Psychiatric:        Attention and Perception: Attention normal.        Mood and Affect: Mood normal.        Speech: Speech normal.        Behavior: Behavior normal.        Thought Content: Thought content normal.        Cognition and Memory: Cognition normal.        Judgment: Judgment normal.   Most Recent Fall Risk Assessment:    08/29/2022   10:01 AM  Fall Risk   Falls in the past year? 0  Number falls in past yr: 0  Injury with Fall? 0  Risk for fall due to : No Fall Risks  Follow up Falls prevention discussed   Most Recent Depression Screenings:    09/02/2023    9:47 AM 08/29/2022   10:01 AM  PHQ 2/9 Scores  PHQ - 2 Score 0 0   Results:  Studies Obtained And Personally Reviewed By Me:  PAP Smear  08/29/2022  normal.  Mammogram 09/24/2022  normal.  Colonoscopy 10/24/2016  normal.  Labs:     Component Value Date/Time   NA 141 09/01/2023 0923   K 4.6 09/01/2023 0923   CL 109 09/01/2023 0923   CO2 28 09/01/2023 0923   GLUCOSE 100 (H) 09/01/2023 0923   BUN  12 09/01/2023 0923   CREATININE 0.76 09/01/2023 0923   CALCIUM  9.5 09/01/2023 0923   PROT 6.9 09/01/2023 0923   AST 25 09/01/2023 0923   ALT 20 09/01/2023 0923   BILITOT 0.3 09/01/2023 0923   GFRNONAA 81 08/18/2020 1129   GFRAA 94 08/18/2020 1129    Lab Results  Component Value Date   WBC 5.8 09/01/2023   HGB 11.9 09/01/2023   HCT 39.0 09/01/2023   MCV 72.0 (L) 09/01/2023   PLT 293 09/01/2023   Lab Results  Component Value Date   CHOL 144 09/01/2023   HDL 54 09/01/2023   LDLCALC 75 09/01/2023   TRIG 69 09/01/2023   CHOLHDL 2.7 09/01/2023    Lab Results  Component Value Date   TSH 3.98 09/01/2023    Assessment & Plan:   Orders Placed This Encounter  Procedures   POCT URINALYSIS DIP (CLINITEK)   IFOBT POC (occult bld, rslt in office)   Meds ordered this encounter  Medications   DISCONTD: rosuvastatin  (CRESTOR ) 10 MG tablet    Sig: Take 1 tablet (10 mg total) by mouth daily.    Dispense:  90 tablet    Refill:  2   rosuvastatin  (CRESTOR ) 10 MG tablet    Sig: Take 1 tablet (10 mg total) by mouth daily.    Dispense:  90 tablet    Refill:  3    State is [place]   Other Labs Reviewed today: CBC, compared to 2024: RBC 5.42, decreased from 5.73; MCV 72, elevated from 70.7; MCH 22, elevated from 21.6; MCHC 30.5, decreased from 30.6; RDW 15.8, decreased from 16; otherwise WNL.  CMP, compared to 08/2022: Glucose 100, elevated from 87; otherwise WNL.   TSH: 3.98  Hyperlipidemia treated with Rosuvastatin  10 mg daily. 09/01/2023 Lipid Panel: WNL.    PAP Smear 08/29/2022  normal  with repeat recommendation of 2027.  Mammogram 09/24/2022  normal  with repeat scheduled 09/25/2023.  Colonoscopy 10/24/2016   normal  with repeat recommendation of 2028.  Bone Density will be due 2028.   Vaccine Counseling: Due for Covid-19 and Shingles 2/2; UTD on Flu and Tdap.  Return in 1 year (on 09/06/2024) for annual labs and 09/07/2024 for annual visit.   Annual wellness visit done today including the all of the following: Reviewed patient's Family Medical History Reviewed and updated list of patient's medical providers Assessment of cognitive impairment was done Assessed patient's functional ability Established a written schedule for health screening services Health Risk Assessent Completed and Reviewed  Discussed health benefits of physical activity, and encouraged her to engage in regular exercise appropriate for her age and condition.    I,Emily Lagle,acting as a Neurosurgeon for Sylvan Evener, MD.,have documented all relevant documentation on the behalf of Sylvan Evener, MD,as directed by  Sylvan Evener, MD while in the presence of Sylvan Evener, MD.   ***

## 2023-09-01 ENCOUNTER — Other Ambulatory Visit: Payer: No Typology Code available for payment source

## 2023-09-01 DIAGNOSIS — R7989 Other specified abnormal findings of blood chemistry: Secondary | ICD-10-CM

## 2023-09-01 DIAGNOSIS — Z0000 Encounter for general adult medical examination without abnormal findings: Secondary | ICD-10-CM

## 2023-09-01 DIAGNOSIS — E7800 Pure hypercholesterolemia, unspecified: Secondary | ICD-10-CM

## 2023-09-02 ENCOUNTER — Encounter: Payer: Self-pay | Admitting: Internal Medicine

## 2023-09-02 ENCOUNTER — Ambulatory Visit: Payer: No Typology Code available for payment source | Admitting: Internal Medicine

## 2023-09-02 ENCOUNTER — Ambulatory Visit: Payer: Self-pay | Admitting: Internal Medicine

## 2023-09-02 VITALS — BP 130/80 | HR 64 | Ht 61.5 in | Wt 158.0 lb

## 2023-09-02 DIAGNOSIS — Z1211 Encounter for screening for malignant neoplasm of colon: Secondary | ICD-10-CM

## 2023-09-02 DIAGNOSIS — Z6829 Body mass index (BMI) 29.0-29.9, adult: Secondary | ICD-10-CM | POA: Diagnosis not present

## 2023-09-02 DIAGNOSIS — Z0000 Encounter for general adult medical examination without abnormal findings: Secondary | ICD-10-CM

## 2023-09-02 DIAGNOSIS — E7800 Pure hypercholesterolemia, unspecified: Secondary | ICD-10-CM

## 2023-09-02 DIAGNOSIS — D563 Thalassemia minor: Secondary | ICD-10-CM

## 2023-09-02 DIAGNOSIS — E785 Hyperlipidemia, unspecified: Secondary | ICD-10-CM | POA: Diagnosis not present

## 2023-09-02 LAB — POCT URINALYSIS DIP (CLINITEK)
Bilirubin, UA: NEGATIVE
Blood, UA: NEGATIVE
Glucose, UA: NEGATIVE mg/dL
Ketones, POC UA: NEGATIVE mg/dL
Leukocytes, UA: NEGATIVE
Nitrite, UA: NEGATIVE
POC PROTEIN,UA: NEGATIVE
Spec Grav, UA: 1.01 (ref 1.010–1.025)
Urobilinogen, UA: 0.2 U/dL
pH, UA: 6.5 (ref 5.0–8.0)

## 2023-09-02 LAB — COMPLETE METABOLIC PANEL WITHOUT GFR
AG Ratio: 1.4 (calc) (ref 1.0–2.5)
ALT: 20 U/L (ref 6–29)
AST: 25 U/L (ref 10–35)
Albumin: 4 g/dL (ref 3.6–5.1)
Alkaline phosphatase (APISO): 69 U/L (ref 37–153)
BUN: 12 mg/dL (ref 7–25)
CO2: 28 mmol/L (ref 20–32)
Calcium: 9.5 mg/dL (ref 8.6–10.4)
Chloride: 109 mmol/L (ref 98–110)
Creat: 0.76 mg/dL (ref 0.50–1.05)
Globulin: 2.9 g/dL (ref 1.9–3.7)
Glucose, Bld: 100 mg/dL — ABNORMAL HIGH (ref 65–99)
Potassium: 4.6 mmol/L (ref 3.5–5.3)
Sodium: 141 mmol/L (ref 135–146)
Total Bilirubin: 0.3 mg/dL (ref 0.2–1.2)
Total Protein: 6.9 g/dL (ref 6.1–8.1)

## 2023-09-02 LAB — CBC WITH DIFFERENTIAL/PLATELET
Absolute Lymphocytes: 2917 {cells}/uL (ref 850–3900)
Absolute Monocytes: 447 {cells}/uL (ref 200–950)
Basophils Absolute: 29 {cells}/uL (ref 0–200)
Basophils Relative: 0.5 %
Eosinophils Absolute: 110 {cells}/uL (ref 15–500)
Eosinophils Relative: 1.9 %
HCT: 39 % (ref 35.0–45.0)
Hemoglobin: 11.9 g/dL (ref 11.7–15.5)
MCH: 22 pg — ABNORMAL LOW (ref 27.0–33.0)
MCHC: 30.5 g/dL — ABNORMAL LOW (ref 32.0–36.0)
MCV: 72 fL — ABNORMAL LOW (ref 80.0–100.0)
MPV: 11.8 fL (ref 7.5–12.5)
Monocytes Relative: 7.7 %
Neutro Abs: 2297 {cells}/uL (ref 1500–7800)
Neutrophils Relative %: 39.6 %
Platelets: 293 10*3/uL (ref 140–400)
RBC: 5.42 10*6/uL — ABNORMAL HIGH (ref 3.80–5.10)
RDW: 15.8 % — ABNORMAL HIGH (ref 11.0–15.0)
Total Lymphocyte: 50.3 %
WBC: 5.8 10*3/uL (ref 3.8–10.8)

## 2023-09-02 LAB — IFOBT (OCCULT BLOOD): IFOBT: NEGATIVE

## 2023-09-02 LAB — LIPID PANEL
Cholesterol: 144 mg/dL (ref <200)
HDL: 54 mg/dL (ref >=50)
LDL Cholesterol (Calc): 75 mg/dL
Non-HDL Cholesterol (Calc): 90 mg/dL (ref <130)
Total CHOL/HDL Ratio: 2.7 (calc) (ref <5.0)
Triglycerides: 69 mg/dL (ref <150)

## 2023-09-02 LAB — TSH: TSH: 3.98 m[IU]/L (ref 0.40–4.50)

## 2023-09-02 MED ORDER — ROSUVASTATIN CALCIUM 10 MG PO TABS
10.0000 mg | ORAL_TABLET | Freq: Every day | ORAL | 3 refills | Status: DC
Start: 2023-09-02 — End: 2023-09-02

## 2023-09-02 MED ORDER — ROSUVASTATIN CALCIUM 10 MG PO TABS
10.0000 mg | ORAL_TABLET | Freq: Every day | ORAL | 3 refills | Status: AC
Start: 2023-09-02 — End: ?

## 2023-09-02 MED ORDER — ROSUVASTATIN CALCIUM 10 MG PO TABS: 10.0000 mg | ORAL_TABLET | Freq: Every day | ORAL | 2 refills | Status: DC

## 2023-09-03 ENCOUNTER — Encounter: Payer: Self-pay | Admitting: Internal Medicine

## 2023-09-03 NOTE — Patient Instructions (Signed)
 It was a pleasure to see you today. Labs are stable. Continue to work on diet exercise and weight loss. Return in one year or as needed.

## 2023-09-25 ENCOUNTER — Ambulatory Visit
Admission: RE | Admit: 2023-09-25 | Discharge: 2023-09-25 | Disposition: A | Discharge status: Home / Self Care | Source: Ambulatory Visit | Attending: Internal Medicine | Admitting: Internal Medicine

## 2023-09-25 DIAGNOSIS — Z0000 Encounter for general adult medical examination without abnormal findings: Secondary | ICD-10-CM

## 2023-12-02 ENCOUNTER — Encounter: Payer: Self-pay | Admitting: Nurse Practitioner

## 2023-12-02 ENCOUNTER — Ambulatory Visit: Admitting: Nurse Practitioner

## 2023-12-02 VITALS — BP 122/84 | HR 76 | Temp 97.0°F

## 2023-12-02 DIAGNOSIS — Z789 Other specified health status: Secondary | ICD-10-CM

## 2023-12-02 NOTE — Progress Notes (Signed)
 [facility]- [facility]  Subjective:  Patient ID: Maria Delacruz, female    DOB: 01/17/1961  Age: 63 y.o. MRN: [id number]  CC: Wellness exam   HPI Maria Delacruz presents for wellness exam visit for insurance benefit.  Patient has a PCP: Dr. Perri PMH significant for: HLD on crestor  Last labs per PCP were completed: May 2025  Health Maintenance:  Colonoscopy: July 2018, repeat in 10 years Mammogram: June 2025 Pap: May 2024, repeat in 5 years    Smoker:Never   Immunizations:  Shingrix-  completed in 2023 Flu- gets this yearly Tdap: completed, due 2028   Lifestyle: Diet- no particular diet Exercise- very active at work, works Law[employer]     Past Medical History:  Diagnosis Date   Hypertension 04/2016   Due to stress.   Leg pain, bilateral    Sciatica     Past Surgical History:  Procedure Laterality Date   CESAREAN SECTION     twice   MYOMECTOMY      Outpatient Medications Prior to Visit  Medication Sig Dispense Refill   Multiple Vitamins-Minerals (ONE-A-DAY WOMENS 50 PLUS PO) Take by mouth daily.     rosuvastatin  (CRESTOR ) 10 MG tablet Take 1 tablet (10 mg total) by mouth daily. 90 tablet 3   No facility-administered medications prior to visit.    ROS Review of Systems  Constitutional:  Negative for activity change.  HENT:  Negative for hearing loss.   Eyes:  Negative for visual disturbance.  Respiratory:  Negative for shortness of breath.   Cardiovascular:  Negative for chest pain and leg swelling.  Gastrointestinal:  Positive for constipation (resolves with otc treatment.). Negative for diarrhea, nausea and vomiting.  Neurological:  Negative for dizziness and headaches.    Objective:  BP 122/84 (BP Location: Left Arm, Patient Position: Sitting, Cuff Size: Normal)   Pulse 76   Temp (!) 97 F (36.1 C)   SpO2 97%   Physical Exam Constitutional:      General: She is not in acute distress. HENT:     Head: Normocephalic.     Right Ear:  Tympanic membrane, ear canal and external ear normal. There is no impacted cerumen.     Left Ear: Tympanic membrane, ear canal and external ear normal. There is no impacted cerumen.     Nose: Nose normal.     Mouth/Throat:     Pharynx: No posterior oropharyngeal erythema.  Eyes:     Pupils: Pupils are equal, round, and reactive to light.  Cardiovascular:     Rate and Rhythm: Normal rate and regular rhythm.     Heart sounds: Normal heart sounds.  Pulmonary:     Effort: Pulmonary effort is normal.     Breath sounds: Normal breath sounds.  Abdominal:     General: Bowel sounds are normal.     Palpations: Abdomen is soft.  Musculoskeletal:     Cervical back: Normal range of motion.     Right lower leg: No edema.     Left lower leg: No edema.  Neurological:     General: No focal deficit present.     Mental Status: She is alert and oriented to person, place, and time.  Psychiatric:        Mood and Affect: Mood normal.        Behavior: Behavior normal.      Assessment & Plan:    Maria Delacruz was seen today for wellness exam.  Diagnoses and all orders for this visit:  Participant in health and wellness plan  Adult wellness physical was conducted today. Importance of diet and exercise were discussed in detail.  We reviewed immunizations and gave recommendations regarding current immunization needed for age.  Preventative health exams are up to date.   Patient was advised yearly wellness exam and follow-up with PCP as scheduled.   No orders of the defined types were placed in this encounter.   No orders of the defined types were placed in this encounter.   Follow-up: as needed.

## 2024-09-06 ENCOUNTER — Other Ambulatory Visit: Payer: Self-pay

## 2024-09-07 ENCOUNTER — Encounter: Payer: Self-pay | Admitting: Internal Medicine
